# Patient Record
Sex: Female | Born: 1955 | Race: White | Hispanic: No | Marital: Married | Smoking: Former smoker
Health system: Southern US, Community
[De-identification: ages and names within clinical notes are randomized; demographics above are authoritative.]

## PROBLEM LIST (undated history)

## (undated) DIAGNOSIS — I10 Essential (primary) hypertension: Secondary | ICD-10-CM

## (undated) DIAGNOSIS — A6000 Herpesviral infection of urogenital system, unspecified: Secondary | ICD-10-CM

## (undated) DIAGNOSIS — E039 Hypothyroidism, unspecified: Secondary | ICD-10-CM

## (undated) DIAGNOSIS — G4730 Sleep apnea, unspecified: Secondary | ICD-10-CM

## (undated) DIAGNOSIS — M797 Fibromyalgia: Secondary | ICD-10-CM

## (undated) HISTORY — DX: Herpesviral infection of urogenital system, unspecified: A60.00

## (undated) HISTORY — DX: Fibromyalgia: M79.7

## (undated) HISTORY — DX: Essential (primary) hypertension: I10

## (undated) HISTORY — DX: Hypothyroidism, unspecified: E03.9

## (undated) HISTORY — PX: TEMPOROMANDIBULAR JOINT SURGERY: SHX35

## (undated) HISTORY — DX: Sleep apnea, unspecified: G47.30

---

## 1998-04-20 ENCOUNTER — Other Ambulatory Visit: Admission: RE | Admit: 1998-04-20 | Discharge: 1998-04-20 | Payer: Self-pay | Admitting: *Deleted

## 1999-06-25 ENCOUNTER — Other Ambulatory Visit: Admission: RE | Admit: 1999-06-25 | Discharge: 1999-06-25 | Payer: Self-pay | Admitting: Obstetrics & Gynecology

## 2000-01-25 ENCOUNTER — Encounter: Admission: RE | Admit: 2000-01-25 | Discharge: 2000-01-25 | Payer: Self-pay | Admitting: Otolaryngology

## 2000-01-25 ENCOUNTER — Encounter: Payer: Self-pay | Admitting: Otolaryngology

## 2000-02-25 ENCOUNTER — Encounter: Admission: RE | Admit: 2000-02-25 | Discharge: 2000-02-25 | Payer: Self-pay | Admitting: Oral Surgery

## 2000-02-25 ENCOUNTER — Encounter: Payer: Self-pay | Admitting: Oral Surgery

## 2000-02-28 ENCOUNTER — Ambulatory Visit (HOSPITAL_BASED_OUTPATIENT_CLINIC_OR_DEPARTMENT_OTHER): Admission: RE | Admit: 2000-02-28 | Discharge: 2000-02-29 | Payer: Self-pay | Admitting: Oral Surgery

## 2000-07-17 ENCOUNTER — Other Ambulatory Visit: Admission: RE | Admit: 2000-07-17 | Discharge: 2000-07-17 | Payer: Self-pay | Admitting: Obstetrics and Gynecology

## 2000-09-08 ENCOUNTER — Ambulatory Visit (HOSPITAL_COMMUNITY): Admission: RE | Admit: 2000-09-08 | Discharge: 2000-09-08 | Payer: Self-pay | Admitting: *Deleted

## 2000-09-08 ENCOUNTER — Encounter (INDEPENDENT_AMBULATORY_CARE_PROVIDER_SITE_OTHER): Payer: Self-pay | Admitting: Specialist

## 2001-07-17 ENCOUNTER — Other Ambulatory Visit: Admission: RE | Admit: 2001-07-17 | Discharge: 2001-07-17 | Payer: Self-pay | Admitting: Obstetrics and Gynecology

## 2002-03-13 ENCOUNTER — Ambulatory Visit (HOSPITAL_BASED_OUTPATIENT_CLINIC_OR_DEPARTMENT_OTHER): Admission: RE | Admit: 2002-03-13 | Discharge: 2002-03-13 | Payer: Self-pay | Admitting: *Deleted

## 2002-03-14 ENCOUNTER — Encounter (INDEPENDENT_AMBULATORY_CARE_PROVIDER_SITE_OTHER): Payer: Self-pay | Admitting: Specialist

## 2002-09-18 ENCOUNTER — Other Ambulatory Visit: Admission: RE | Admit: 2002-09-18 | Discharge: 2002-09-18 | Payer: Self-pay | Admitting: Obstetrics and Gynecology

## 2003-04-15 ENCOUNTER — Encounter: Admission: RE | Admit: 2003-04-15 | Discharge: 2003-04-15 | Payer: Self-pay

## 2004-01-06 ENCOUNTER — Other Ambulatory Visit: Admission: RE | Admit: 2004-01-06 | Discharge: 2004-01-06 | Payer: Self-pay | Admitting: Obstetrics and Gynecology

## 2004-10-07 ENCOUNTER — Emergency Department (HOSPITAL_COMMUNITY): Admission: EM | Admit: 2004-10-07 | Discharge: 2004-10-07 | Payer: Self-pay | Admitting: Family Medicine

## 2004-10-10 ENCOUNTER — Emergency Department (HOSPITAL_COMMUNITY): Admission: EM | Admit: 2004-10-10 | Discharge: 2004-10-10 | Payer: Self-pay | Admitting: Family Medicine

## 2005-08-05 ENCOUNTER — Other Ambulatory Visit: Admission: RE | Admit: 2005-08-05 | Discharge: 2005-08-05 | Payer: Self-pay | Admitting: Obstetrics and Gynecology

## 2006-07-25 ENCOUNTER — Emergency Department (HOSPITAL_COMMUNITY): Admission: EM | Admit: 2006-07-25 | Discharge: 2006-07-25 | Payer: Self-pay | Admitting: Emergency Medicine

## 2006-08-22 ENCOUNTER — Ambulatory Visit: Payer: Self-pay | Admitting: Family Medicine

## 2006-08-23 ENCOUNTER — Ambulatory Visit: Payer: Self-pay | Admitting: *Deleted

## 2006-09-25 ENCOUNTER — Ambulatory Visit: Payer: Self-pay | Admitting: Family Medicine

## 2006-10-31 ENCOUNTER — Ambulatory Visit: Payer: Self-pay | Admitting: Family Medicine

## 2006-12-01 ENCOUNTER — Ambulatory Visit: Payer: Self-pay | Admitting: Family Medicine

## 2006-12-06 ENCOUNTER — Encounter: Admission: RE | Admit: 2006-12-06 | Discharge: 2006-12-06 | Payer: Self-pay | Admitting: Family Medicine

## 2006-12-15 ENCOUNTER — Ambulatory Visit: Payer: Self-pay | Admitting: Family Medicine

## 2006-12-22 ENCOUNTER — Ambulatory Visit: Payer: Self-pay | Admitting: Family Medicine

## 2006-12-22 LAB — CONVERTED CEMR LAB
BUN: 19 mg/dL (ref 6–23)
Creatinine, Ser: 0.75 mg/dL (ref 0.40–1.20)
Glucose, Bld: 91 mg/dL (ref 70–99)
Sodium: 142 meq/L (ref 135–145)

## 2007-01-02 ENCOUNTER — Ambulatory Visit (HOSPITAL_COMMUNITY): Admission: RE | Admit: 2007-01-02 | Discharge: 2007-01-02 | Payer: Self-pay | Admitting: Family Medicine

## 2007-01-09 ENCOUNTER — Emergency Department (HOSPITAL_COMMUNITY): Admission: EM | Admit: 2007-01-09 | Discharge: 2007-01-09 | Payer: Self-pay | Admitting: Emergency Medicine

## 2007-01-23 ENCOUNTER — Ambulatory Visit: Payer: Self-pay | Admitting: Family Medicine

## 2007-01-23 LAB — CONVERTED CEMR LAB
TSH: 4.627 microintl units/mL (ref 0.350–5.50)
Total CHOL/HDL Ratio: 5
VLDL: 25 mg/dL (ref 0–40)

## 2007-05-15 ENCOUNTER — Ambulatory Visit: Payer: Self-pay | Admitting: Family Medicine

## 2007-05-15 LAB — CONVERTED CEMR LAB
Chloride: 98 meq/L (ref 96–112)
Potassium: 3.4 meq/L — ABNORMAL LOW (ref 3.5–5.3)
Sodium: 139 meq/L (ref 135–145)

## 2007-05-18 ENCOUNTER — Ambulatory Visit (HOSPITAL_COMMUNITY): Admission: RE | Admit: 2007-05-18 | Discharge: 2007-05-18 | Payer: Self-pay | Admitting: Family Medicine

## 2007-06-18 ENCOUNTER — Encounter: Admission: RE | Admit: 2007-06-18 | Discharge: 2007-06-18 | Payer: Self-pay | Admitting: Family Medicine

## 2007-06-21 ENCOUNTER — Ambulatory Visit: Payer: Self-pay | Admitting: Family Medicine

## 2007-06-21 LAB — CONVERTED CEMR LAB
CO2: 23 meq/L (ref 19–32)
Free T4: 1.09 ng/dL (ref 0.89–1.80)
Potassium: 3.6 meq/L (ref 3.5–5.3)
Sodium: 144 meq/L (ref 135–145)
T3, Total: 147.2 ng/dL (ref 80.0–204.0)
T4, Total: 8.3 ug/dL (ref 5.0–12.5)
TSH: 5.314 microintl units/mL (ref 0.350–5.50)

## 2007-07-19 ENCOUNTER — Ambulatory Visit: Payer: Self-pay | Admitting: Family Medicine

## 2007-08-24 ENCOUNTER — Ambulatory Visit: Payer: Self-pay | Admitting: Family Medicine

## 2007-12-06 ENCOUNTER — Ambulatory Visit (HOSPITAL_BASED_OUTPATIENT_CLINIC_OR_DEPARTMENT_OTHER): Admission: RE | Admit: 2007-12-06 | Discharge: 2007-12-06 | Payer: Self-pay | Admitting: Family Medicine

## 2007-12-10 ENCOUNTER — Ambulatory Visit: Payer: Self-pay | Admitting: Internal Medicine

## 2007-12-14 ENCOUNTER — Emergency Department (HOSPITAL_COMMUNITY): Admission: EM | Admit: 2007-12-14 | Discharge: 2007-12-14 | Payer: Self-pay | Admitting: Family Medicine

## 2008-01-14 ENCOUNTER — Ambulatory Visit: Payer: Self-pay | Admitting: Internal Medicine

## 2008-03-03 ENCOUNTER — Encounter: Admission: RE | Admit: 2008-03-03 | Discharge: 2008-03-03 | Payer: Self-pay | Admitting: Family Medicine

## 2008-04-01 ENCOUNTER — Ambulatory Visit: Payer: Self-pay | Admitting: Family Medicine

## 2008-04-01 LAB — CONVERTED CEMR LAB
ALT: 12 units/L (ref 0–35)
Albumin: 4.3 g/dL (ref 3.5–5.2)
Calcium: 9.8 mg/dL (ref 8.4–10.5)
Potassium: 3.2 meq/L — ABNORMAL LOW (ref 3.5–5.3)
Sodium: 141 meq/L (ref 135–145)
Total Bilirubin: 0.5 mg/dL (ref 0.3–1.2)
Total Protein: 7.5 g/dL (ref 6.0–8.3)
Vit D, 1,25-Dihydroxy: 21 — ABNORMAL LOW (ref 30–89)

## 2008-08-21 ENCOUNTER — Ambulatory Visit: Payer: Self-pay | Admitting: Internal Medicine

## 2008-08-21 ENCOUNTER — Encounter (INDEPENDENT_AMBULATORY_CARE_PROVIDER_SITE_OTHER): Payer: Self-pay | Admitting: Family Medicine

## 2008-08-21 LAB — CONVERTED CEMR LAB
HCV Ab: NEGATIVE
Hep B E Ab: NEGATIVE
Hepatitis B Surface Ag: NEGATIVE

## 2009-09-07 ENCOUNTER — Emergency Department (HOSPITAL_COMMUNITY): Admission: EM | Admit: 2009-09-07 | Discharge: 2009-09-07 | Payer: Self-pay | Admitting: Family Medicine

## 2009-11-02 ENCOUNTER — Emergency Department (HOSPITAL_COMMUNITY): Admission: EM | Admit: 2009-11-02 | Discharge: 2009-11-02 | Payer: Self-pay | Admitting: Emergency Medicine

## 2009-11-02 ENCOUNTER — Ambulatory Visit: Payer: Self-pay | Admitting: Vascular Surgery

## 2009-11-02 ENCOUNTER — Encounter (INDEPENDENT_AMBULATORY_CARE_PROVIDER_SITE_OTHER): Payer: Self-pay | Admitting: Emergency Medicine

## 2009-11-03 ENCOUNTER — Observation Stay (HOSPITAL_COMMUNITY): Admission: EM | Admit: 2009-11-03 | Discharge: 2009-11-04 | Payer: Self-pay | Admitting: Emergency Medicine

## 2009-11-24 ENCOUNTER — Ambulatory Visit: Payer: Self-pay | Admitting: Family Medicine

## 2009-11-26 ENCOUNTER — Encounter (HOSPITAL_COMMUNITY): Admission: RE | Admit: 2009-11-26 | Discharge: 2010-01-27 | Payer: Self-pay | Admitting: Interventional Cardiology

## 2010-06-13 ENCOUNTER — Encounter: Payer: Self-pay | Admitting: Occupational Therapy

## 2010-06-13 ENCOUNTER — Encounter: Payer: Self-pay | Admitting: Family Medicine

## 2010-06-29 ENCOUNTER — Encounter (INDEPENDENT_AMBULATORY_CARE_PROVIDER_SITE_OTHER): Payer: Self-pay | Admitting: Family Medicine

## 2010-06-29 LAB — CONVERTED CEMR LAB
BUN: 16 mg/dL (ref 6–23)
CO2: 29 meq/L (ref 19–32)
Calcium: 9.8 mg/dL (ref 8.4–10.5)
Creatinine, Ser: 0.77 mg/dL (ref 0.40–1.20)
Potassium: 3.8 meq/L (ref 3.5–5.3)
TSH: 3.921 microintl units/mL (ref 0.350–4.500)
Vit D, 25-Hydroxy: 20 ng/mL — ABNORMAL LOW (ref 30–89)

## 2010-08-09 LAB — LIPID PANEL
HDL: 34 mg/dL — ABNORMAL LOW (ref >39)
LDL Cholesterol: 98 mg/dL (ref 0–99)
Total CHOL/HDL Ratio: 4.5 RATIO
Triglycerides: 106 mg/dL (ref <150)
VLDL: 21 mg/dL (ref 0–40)

## 2010-08-09 LAB — CK TOTAL AND CKMB (NOT AT ARMC)
Relative Index: INVALID (ref 0.0–2.5)
Total CK: 92 U/L (ref 7–177)

## 2010-08-09 LAB — CARDIAC PANEL(CRET KIN+CKTOT+MB+TROPI)
CK, MB: 1.9 ng/mL (ref 0.3–4.0)
CK, MB: 2 ng/mL (ref 0.3–4.0)
Relative Index: INVALID (ref 0.0–2.5)
Troponin I: 0.01 ng/mL (ref 0.00–0.06)

## 2010-08-09 LAB — POCT CARDIAC MARKERS: Myoglobin, poc: 109 ng/mL (ref 12–200)

## 2010-08-09 LAB — BASIC METABOLIC PANEL
CO2: 33 mEq/L — ABNORMAL HIGH (ref 19–32)
Calcium: 9.4 mg/dL (ref 8.4–10.5)
Creatinine, Ser: 0.74 mg/dL (ref 0.4–1.2)
GFR calc Af Amer: >60 mL/min (ref >60)
GFR calc non Af Amer: >60 mL/min (ref >60)
Potassium: 3.1 mEq/L — ABNORMAL LOW (ref 3.5–5.1)

## 2010-08-09 LAB — DIFFERENTIAL
Basophils Relative: 0 % (ref 0–1)
Lymphocytes Relative: 23 % (ref 12–46)
Neutro Abs: 5 10*3/uL (ref 1.7–7.7)

## 2010-08-09 LAB — CBC
HCT: 40.4 % (ref 36.0–46.0)
Hemoglobin: 14 g/dL (ref 12.0–15.0)
WBC: 7.1 10*3/uL (ref 4.0–10.5)

## 2010-08-09 LAB — TROPONIN I: Troponin I: 0.01 ng/mL (ref 0.00–0.06)

## 2010-08-09 LAB — URINALYSIS, ROUTINE W REFLEX MICROSCOPIC
Bilirubin Urine: NEGATIVE
Glucose, UA: NEGATIVE mg/dL
Hgb urine dipstick: NEGATIVE
Ketones, ur: NEGATIVE mg/dL
Nitrite: NEGATIVE
Specific Gravity, Urine: 1.012 (ref 1.005–1.030)
pH: 7 (ref 5.0–8.0)

## 2010-09-13 ENCOUNTER — Other Ambulatory Visit (HOSPITAL_COMMUNITY): Payer: Self-pay | Admitting: Family Medicine

## 2010-09-13 DIAGNOSIS — Z1231 Encounter for screening mammogram for malignant neoplasm of breast: Secondary | ICD-10-CM

## 2010-09-20 ENCOUNTER — Ambulatory Visit (HOSPITAL_COMMUNITY): Payer: Self-pay

## 2010-09-28 ENCOUNTER — Ambulatory Visit (HOSPITAL_COMMUNITY)
Admission: RE | Admit: 2010-09-28 | Discharge: 2010-09-28 | Disposition: A | Discharge status: Home / Self Care | Payer: Self-pay | Source: Ambulatory Visit | Attending: Family Medicine | Admitting: Family Medicine

## 2010-09-28 DIAGNOSIS — Z1231 Encounter for screening mammogram for malignant neoplasm of breast: Secondary | ICD-10-CM | POA: Insufficient documentation

## 2010-10-05 NOTE — Procedures (Signed)
NAMEELYANA, GRABSKI                  ACCOUNT NO.:  [id number]   MEDICAL RECORD NO.:  [id number]          PATIENT TYPE:  OUT   LOCATION:  SLEEP CENTER                 FACILITY:  [facility]   PHYSICIAN:  Clinton D. Maple Hudson, MD, FCCP, FACPDATE OF BIRTH:  March 28, 1956   DATE OF STUDY:  12/06/2007                            NOCTURNAL POLYSOMNOGRAM   REFERRING PHYSICIAN:  Maurice March, M.D.   INDICATION FOR STUDY:  Insomnia with sleep apnea.   EPWORTH SLEEPINESS SCORE:  7/24, BMI 44, weight 227 pounds, height 60.5  inches, neck 15 inches.   MEDICATIONS:  Home medications charted and reviewed.   SLEEP ARCHITECTURE:  Split study protocol.  During the diagnostic phase,  total sleep time 121.5 minutes with sleep efficiency 76.2%.  Stage I was  3.7%, stage II 82.7%, stage III absent, REM 13.6% of total sleep time.  Sleep latency 32 minutes, REM latency 52 minutes, awake after sleep  onset 6 minutes, arousal index 37.5.  No bedtime medication taken.   RESPIRATORY DATA:  Split study protocol.  Apnea-hypopnea index (AHI)  29.1 per hour.  Fifty-nine events were scored, all hypopneas recorded  while sleeping non-supine.  REM AHI 69.1.  CPAP was titrated to 12 CWP,  AHI 0 per hour.  She chose a small Snapp mask with heated humidifier.   OXYGEN DATA:  Moderate snoring before CPAP with oxygen desaturation to a  nadir of 81%.  After CPAP control mean oxygen saturation held 92.7% on  room air.   CARDIAC DATA:  Normal sinus rhythm with occasional PVC.   MOVEMENT-PARASOMNIA:  No significant movement disturbance.  Bathroom x1.   IMPRESSIONS-RECOMMENDATIONS:  1. Moderate obstructive sleep apnea/hypopnea syndrome, apnea-hypopnea      index 29.1 per hour.  All events were hypopneas recorded while      sleeping off of her back.  Moderate snoring with oxygen      desaturation to a nadir of 81%.  2. Successful continuous positive airway titration to 12 centimeters      of water pressure, apnea-hypopnea  index 0 per hour.  She chose a      small Snapp mask with heated humidifier.      Clinton D. Maple Hudson, MD, [facility], FACP  Diplomate, Biomedical engineer of Sleep Medicine  Electronically Signed     CDY/MEDQ  D:  12/08/2007 13:49:40  T:  12/08/2007 14:35:22  Job:  [id number]

## 2010-10-08 NOTE — Op Note (Signed)
[facility]  Patient:    Angelica Hess, Angelica Hess                         MRN: [id number] Proc. Date: 02/28/00 Adm. 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  OPERATION:  Bilateral sagittal split ramus osteotomies with mandibular advancement of approximately 10 mm, insertion of custom acrylic malocclusial appliance, rigid internal fixation utilizing the Ryder System 2.0 mm titanium screw system.  SURGEON:  Vania Rea. Warren Danes, D.D.S.  ANESTHESIA:  General via nasal endotracheal intubation.  ESTIMATED BLOOD LOSS:  Less than 250 cc  FLUID REPLACEMENT:  Approximately 1100 cc of crystalloid solution.  COMPLICATIONS:  None apparent.  INDICATION FOR PROCEDURE:  Mrs. 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  The patient presented with chronic complaints of inability to bite and incise foods in a normal type fashion and chronic subluxation of the temporary mandibular joints as a result of the severe mandibular retrognathia.  All conservative measures had been used to correct this conservatively including splint therapy, orthodontics, and the use of nonsteroidal anti-inflammatory medications but the mandibular retrognathia is too severe for any other means of therapy likely to correct this other than surgical intervention.  DESCRIPTION OF PROCEDURE:  On February 28, 2000, Mrs. Marianna Payment was taken to [facility] where she was placed on the operating room table in a supine position.  Following successful nasal endotracheal intubation and general anesthesia, the patients face, neck and oral cavity were prepped and draped in the usual sterile operating room fashion.  The hypopharynx was suctioned free of fluid and secretions, and a moistened 2 inch vaginal pack was placed as a direct pack.  Attention was then directed intraorally, where approximately 10 cc of 1/2% xylocaine containing 1:200,000 epinephrine were infiltrated in the right inferior alveolar neurovascular region and the soft tissues over the ramus and right posterior mandibular buccal vestibule.  A #15 Bard Parker blade was then used to create a 2.5 curvilinear incision beginning in the soft tissues of the ramus and brought through the mucoperiosteal tissues of the right lateral oblique ridge to a distance of approximately 1.5 cm lateral to the second molar tooth. A #9 Molt periosteal elevator was used to reflect a full thickness mucoperiosteal flap laterally and inferiorly to the inferior border of the mandible which was identified using a curved Therapist, nutritional.  The mucosal periosteal tissues were then reflected off of the oblique ridge up the ascending ramus to a distance of approximately 1.5 cm from the tip of the coronoid process by a Dingman bone clamp was placed to retract the tissue superiorly.  The coronoid notch was then identified via a medial approach using a double ended Molt curet and from this level inferiorly the periosteum was reflected to the lingula which was identified and a Selden retractor placed to protect the neurovascular bundle.  A Stryker rotatory osteotome with a 107 Fisher bur followed a Lindeman bur was then used to create a medial, cortical,  horizontal osteotomy from just above the lingula to the anterior midline of the ramus.  A channel retractor was then placed to protect the lateral soft tissues and then the [facility] bur was used to create a lateral, vertical, cortical  osteotomy from the inferior border of the mandible to a distance of approximately 1 cm lateral to the second molar tooth.  The osteotomies were then joined across the superior oblique ridge portion of the mandible to superior cortical bone using 107 Fisher bur.  In a similar fashion, the left mandible was cut in preparation for a sagittal split ramus osteotomy.  The left mandible was then split in a sagittal fashion using the mandibular osteotomes with gentle tapping and twisting pressures from the fiberglass tip mallet.  The mandible split appropriately and the neurovascular bundle remained intact.  The pterygomasseteric apparatus was then detached from the distal portion of the mandible using a pterygomasseteric sling stripper.  In a similar fashion, the right mandible was split in a sagittal fashion, the neurovascular bundle remaining intact and the pterygomasseteric apparatus detached from the inferior border of the distal portion of the mandible.  A custom acrylic interocclusal appliance was then affixed to the maxillary dentition and secured by passing four 28 gauge stainless wire ligature loops in a circum orthodontic fashion where the ends of the wires were cut, twisted and rosetted atraumatically against the buccal surface of the gingival soft tissues.  The oral cavity and surgical sites were then thoroughly irrigated with sterile saline irrigating solution and suctioned.  The throat pack was removed and the hypopharynx suctioned free of fluids and secretions.  The mandible was now advanced approximately 10 mm and the mandibular dentition fit appropriately into the undersurface of the appliance.  The teeth were then secured in this fashion in an anatomic manner into the splint using 26 gauge stainless steel wire ligature loops with the ends of the wires cut, twisted and rosetted atraumatically against the buccal surface of the dentition.  The columellar segments were then  posteriorly and superiorly into the glenoid fossa with digital pressure being used to ascertain sitting of the condyles bilaterally.  The condylar segments were then secured by passing bilateral 24  gauge stainless steel wire ligature loops in a circum mandibular fashion. Rigid internal fixation was then applied in the following manner:  The 56M minidriver with 1.5 mm drill was used to create three bicortical holes across the superior oblique ridge portion of the mandible bilaterally.  The holes were then tapped using the Zachery Dauer 2.0 mm tap and appropriate screws were placed with three being closed on either side of the mandible.  The aforementioned 24 gauge circum mandibular stainless steel wires were then cut and removed from the surgical site.  The surgical sites were then copiously irrigated with sterile saline irrigating solution and suctioned.  The mucoperiosteal margins were then approximated in a water tight fashion using 4-0 Vicryl material on an RB1 needle.  The intermaxillary fixation loops were then cut and removed from the oral cavity and the mandible was manipulated and found to fit appropriately without hindrance or deviation.  Class I elastics were then placed bilaterally and the patient was allowed to awaken from the anesthesia and taken to the recovery room where she tolerated the procedure well and without apparent complication. DD:  02/28/00 TD:  02/29/00 Job: [id number]

## 2010-10-08 NOTE — Op Note (Signed)
   NAMEMarianna Hess, Angelica Hess                            ACCOUNT NO.:  [id number]   MEDICAL RECORD NO.:  [id number]                   PATIENT TYPE:  AMB   LOCATION:  DSC                                  FACILITY:  [facility]   PHYSICIAN:  Vikki Ports, M.D.         DATE OF BIRTH:  01/01/1956   DATE OF PROCEDURE:  03/13/2002  DATE OF DISCHARGE:  03/13/2002                                 OPERATIVE REPORT   PREOPERATIVE DIAGNOSES:  Rule out polymyositis.   POSTOPERATIVE DIAGNOSES:  Rule out polymyositis.   OPERATION PERFORMED:  Left quadriceps muscle biopsy.   SURGEON:  Vikki Ports, M.D.   ANESTHESIA:  General.   DESCRIPTION OF PROCEDURE:  The patient was taken to the operating room and  placed in supine position.  The medial portion of the left thigh was prepped  and draped in the normal sterile fashion.  Using a vertical incision, I  dissected down through the subcutaneous fat down onto the fascia of the  quadriceps muscle.  This was incised.  About a 4 to 5 mm diameter segment of  muscle was mobilized using a hemostat.  Each end was clamped.  A Q-Tip was  placed over the specimen and each end was ligated using 2-0 silk ligature.  It was divided and sent to pathology for evaluation.  The edges of clamped  hemostats were ligated using 2-0 silk ligatures.  Adequate hemostasis was  ensured.  The fascia was closed with a 2-0 Vicryl suture.  The skin was  closed with a subcuticular 4-0 Monocryl.  Steri-Strips were applied.  The  patient tolerated the procedure well and went to PACU in good condition.                                                Vikki Ports, M.D.    KRH/MEDQ  D:  03/16/2002  T:  03/18/2002  Job:  [id number]   cc:   Areatha Keas, M.D.  [address]  [address]  [place]  [place] [address]  Fax: [phone]

## 2010-10-08 NOTE — Op Note (Signed)
[facility]  Patient:    Angelica Hess, Angelica Hess                         MRN: [id number] Proc. Date: 09/08/00 Adm. Date:  [id number] Attending:  Vikki Ports.                           Operative Report  PREOPERATIVE DIAGNOSIS:  Multiple subcutaneous nodules, lipomas.  POSTOPERATIVE DIAGNOSIS:  Multiple subcutaneous nodules, lipomas.  PROCEDURE:  Excision of multiple subcutaneous lipomas and placements are  1. Bilateral lower back, each 5 cm, one left upper arm, 3 cm. 2. Left thigh, 5 cm and 3 cm, one right lower abdominal wall, 2 cm three    right lower arm, 1 cm each and two right upper arms, 2 cm and 1 cm.  ANESTHESIA:  General.  SURGEON:  Vikki Ports, M.D.  DESCRIPTION OF PROCEDURE:  The patient was taken to the operating room and placed in the supine position.  After adequate general anesthesia was induced, the patient was placed in the left lateral decubitus position.  The lower back was prepped and draped in a normal sterile fashion.  Transverse incisions were made over the two largest palpable nodules in the lower back.  Of note, the patient had multiple, in the 100s, subcutaneous nodules.  Incisions were made over the largest two, dissected down and delivered, each 4-5 cm lipoma from each wound.  The skin was closed with 3-0 nylon suture.  The patient was then placed in the prone position.  In the above position, incisions were made over all the underlying nodules, dissecting down and delivering what appeared to be benign fatty lobules through the wounds.  They were all closed with 3-0 nylon suture.  Sterile dressings were applied to all of them.  The patient was taken to PACU in good condition. DD:  09/08/00 TD:  09/09/00 Job: [id number]

## 2010-11-16 ENCOUNTER — Other Ambulatory Visit: Payer: Self-pay | Admitting: Family Medicine

## 2013-01-04 ENCOUNTER — Ambulatory Visit: Payer: Self-pay | Admitting: Family Medicine

## 2013-01-07 ENCOUNTER — Telehealth: Payer: Self-pay | Admitting: Family Medicine

## 2013-01-07 NOTE — Telephone Encounter (Signed)
apptmt scheduled for 01/14/2013 @ 11:45 a.m.

## 2013-01-07 NOTE — Telephone Encounter (Signed)
Pt thought her new pt appmt w/you was this coming Friday 01/11/2013, however, it was actually last Friday, 01/04/2013.  She is very apologetic and embarrassed.  Your next new pat apptmt is not until October, 2014, but she is on b/p and thyroid meds which will run out by the end of August.  Can you accommodate her a new patient apptmt prior to then end of this month? She is off on Aug. 22nd and 25th.  Thank you.

## 2013-01-07 NOTE — Telephone Encounter (Signed)
Ok to use 11:45 and 12:00 slots on 8/25

## 2013-01-14 ENCOUNTER — Other Ambulatory Visit (HOSPITAL_COMMUNITY)
Admission: RE | Admit: 2013-01-14 | Discharge: 2013-01-14 | Disposition: A | Discharge status: Home / Self Care | Payer: BC Managed Care – PPO | Source: Ambulatory Visit | Attending: Family Medicine | Admitting: Family Medicine

## 2013-01-14 ENCOUNTER — Other Ambulatory Visit: Payer: Self-pay

## 2013-01-14 ENCOUNTER — Encounter: Payer: Self-pay | Admitting: Family Medicine

## 2013-01-14 ENCOUNTER — Ambulatory Visit (INDEPENDENT_AMBULATORY_CARE_PROVIDER_SITE_OTHER): Payer: BC Managed Care – PPO | Admitting: Family Medicine

## 2013-01-14 VITALS — BP 140/92 | HR 82 | Temp 98.0°F | Ht 61.0 in | Wt 256.0 lb

## 2013-01-14 DIAGNOSIS — Z1151 Encounter for screening for human papillomavirus (HPV): Secondary | ICD-10-CM | POA: Insufficient documentation

## 2013-01-14 DIAGNOSIS — Z1231 Encounter for screening mammogram for malignant neoplasm of breast: Secondary | ICD-10-CM

## 2013-01-14 DIAGNOSIS — IMO0001 Reserved for inherently not codable concepts without codable children: Secondary | ICD-10-CM

## 2013-01-14 DIAGNOSIS — Z01419 Encounter for gynecological examination (general) (routine) without abnormal findings: Secondary | ICD-10-CM | POA: Insufficient documentation

## 2013-01-14 DIAGNOSIS — E039 Hypothyroidism, unspecified: Secondary | ICD-10-CM

## 2013-01-14 DIAGNOSIS — R413 Other amnesia: Secondary | ICD-10-CM | POA: Insufficient documentation

## 2013-01-14 DIAGNOSIS — M797 Fibromyalgia: Secondary | ICD-10-CM | POA: Insufficient documentation

## 2013-01-14 DIAGNOSIS — I10 Essential (primary) hypertension: Secondary | ICD-10-CM

## 2013-01-14 DIAGNOSIS — B9689 Other specified bacterial agents as the cause of diseases classified elsewhere: Secondary | ICD-10-CM | POA: Insufficient documentation

## 2013-01-14 DIAGNOSIS — Z0000 Encounter for general adult medical examination without abnormal findings: Secondary | ICD-10-CM | POA: Insufficient documentation

## 2013-01-14 DIAGNOSIS — A6000 Herpesviral infection of urogenital system, unspecified: Secondary | ICD-10-CM | POA: Insufficient documentation

## 2013-01-14 DIAGNOSIS — R350 Frequency of micturition: Secondary | ICD-10-CM | POA: Insufficient documentation

## 2013-01-14 DIAGNOSIS — Z136 Encounter for screening for cardiovascular disorders: Secondary | ICD-10-CM

## 2013-01-14 LAB — COMPREHENSIVE METABOLIC PANEL
ALT: 15 U/L (ref 0–35)
AST: 14 U/L (ref 0–37)
Albumin: 4.1 g/dL (ref 3.5–5.2)
Alkaline Phosphatase: 102 U/L (ref 39–117)
Calcium: 9.3 mg/dL (ref 8.4–10.5)
Chloride: 104 mEq/L (ref 96–112)
Potassium: 3.7 mEq/L (ref 3.5–5.1)

## 2013-01-14 LAB — POCT URINALYSIS DIPSTICK
Bilirubin, UA: NEGATIVE
Glucose, UA: NEGATIVE
Nitrite, UA: NEGATIVE
Urobilinogen, UA: NEGATIVE
pH, UA: 5.5

## 2013-01-14 LAB — LIPID PANEL
HDL: 41.2 mg/dL (ref >39.00)
LDL Cholesterol: 134 mg/dL — ABNORMAL HIGH (ref 0–99)
Total CHOL/HDL Ratio: 5
Triglycerides: 126 mg/dL (ref 0.0–149.0)

## 2013-01-14 LAB — T4, FREE: Free T4: 0.71 ng/dL (ref 0.60–1.60)

## 2013-01-14 LAB — TSH: TSH: 6.84 u[IU]/mL — ABNORMAL HIGH (ref 0.35–5.50)

## 2013-01-14 MED ORDER — DILTIAZEM HCL 120 MG PO TABS: 120.0000 mg | ORAL_TABLET | Freq: Two times a day (BID) | ORAL | Status: DC

## 2013-01-14 MED ORDER — LEVOTHYROXINE SODIUM 100 MCG PO TABS: 100.0000 ug | ORAL_TABLET | Freq: Every day | ORAL | Status: DC

## 2013-01-14 NOTE — Progress Notes (Signed)
Subjective:    Patient ID: Angelica Hess, female    DOB: 02/09/1956, 57 y.o.   MRN: [id number]  HPI  57 yo G4P1 here to establish care and for GYN exam.  Last period 1 year ago.  No post menopausal bleeding. Does have h/o recurrent BV and genital herpes.  Has appt for mammogram on 02/04/2013.  HTN- has been well controlled on HCTZ 25 mg daily and Diltiazem 12 mg twice daily.  Denies any HA, blurred vision, CP or SOB.  Hypothyroidism-has been on current dose of synthroid for years.  Has noticed some changes in her hair, otherwise denies any symptoms of hypo or hyperthyroidism.  Fibromyalgia- symptoms improved with water aerobics and vit D supplements.  Was previously on Cymbalta.  Urinary frequency- past week increased frequency.  No dysuria.  Patient Active Problem List   Diagnosis Date Noted  . Encounter for routine gynecological examination 01/14/2013  . Routine general medical examination at a health care facility 01/14/2013  . Memory loss 01/14/2013  . Bacterial vaginosis 01/14/2013  . Urinary frequency 01/14/2013  . Hypertension   . Hypothyroidism   . Genital herpes   . Fibromyalgia    Past Medical History  Diagnosis Date  . Hypertension   . Hypothyroidism   . Genital herpes   . Fibromyalgia   . Sleep apnea    Past Surgical History  Procedure Laterality Date  . Cesarean section    . Temporomandibular joint surgery     History  Substance Use Topics  . Smoking status: Former Games developer  . Smokeless tobacco: Not on file  . Alcohol Use: Not on file   Family History  Problem Relation Age of Onset  . Alzheimer's disease Mother   . Cancer Father     breast CA   Allergies  Allergen Reactions  . Penicillins Itching  . Sulfa Antibiotics     Allergy as a child   No current outpatient prescriptions on file prior to visit.   No current facility-administered medications on file prior to visit.   The PMH, PSH, Social History, Family History, Medications, and allergies  have been reviewed in [facility], and have been updated if relevant.   Review of Systems See HPI Patient reports no  vision/ hearing changes,anorexia, weight change, fever ,adenopathy, persistant / recurrent hoarseness, swallowing issues, chest pain, edema,persistant / recurrent cough, hemoptysis, dyspnea(rest, exertional, paroxysmal nocturnal), gastrointestinal  bleeding (melena, rectal bleeding), abdominal pain, excessive heart burn, focal weakness, severe memory loss, concerning skin lesions, depression, anxiety, abnormal bruising/bleeding, major joint swelling, breast masses or abnormal vaginal bleeding.       Objective:   Physical Exam BP 140/92  Pulse 82  Temp(Src) 98 F (36.7 C)  Ht 5\' 1"  (1.549 m)  Wt 256 lb (116.121 kg)  BMI 48.4 kg/m2  General:  Well-developed,well-nourished,in no acute distress; alert,appropriate and cooperative throughout examination Head:  normocephalic and atraumatic.   Lungs:  Normal respiratory effort, chest expands symmetrically. Lungs are clear to auscultation, no crackles or wheezes. Heart:  Normal rate and regular rhythm. S1 and S2 normal without gallop, murmur, click, rub or other extra sounds. Abdomen:  Bowel sounds positive,abdomen soft and non-tender without masses, organomegaly or hernias noted. Rectal:  no external abnormalities.   Genitalia:  Pelvic Exam:        External: normal female genitalia without lesions or masses        Vagina: normal without lesions or masses        Cervix: normal without lesions or masses  Adnexa: normal bimanual exam without masses or fullness        Uterus: normal by palpation        Pap smear: performed Msk:  No deformity or scoliosis noted of thoracic or lumbar spine.   Extremities:  No clubbing, cyanosis, edema, or deformity noted with normal full range of motion of all joints.   Neurologic:  alert & oriented X3 and gait normal.   Skin:  Intact without suspicious lesions or rashes Cervical Nodes:  No  lymphadenopathy noted Axillary Nodes:  No palpable lymphadenopathy Psych:  Cognition and judgment appear intact. Alert and cooperative with normal attention span and concentration. No apparent delusions, illusions, hallucinations      Assessment & Plan:  1. Hypertension Stable on current rx. Meds refilled. Follow up in 3 months. - Comprehensive metabolic panel  2. Hypothyroidism Recheck thyroid panel today. - TSH - T4, Free  3. Screening for ischemic heart disease  - Lipid panel  4. Encounter for routine gynecological examination Pap smear today.   5. Fibromyalgia  Symptoms improved with lifestyle improvement.  She is working on losing weight.  6. Urinary frequency

## 2013-01-14 NOTE — Patient Instructions (Addendum)
It was nice to meet you. We will call you with your lab and urine culture results.  You may also view them online.

## 2013-01-15 ENCOUNTER — Encounter: Payer: Self-pay | Admitting: Family Medicine

## 2013-01-16 ENCOUNTER — Encounter: Payer: Self-pay | Admitting: Family Medicine

## 2013-01-16 LAB — URINE CULTURE

## 2013-01-17 ENCOUNTER — Encounter: Payer: Self-pay | Admitting: *Deleted

## 2013-01-17 ENCOUNTER — Encounter: Payer: Self-pay | Admitting: Family Medicine

## 2013-01-17 ENCOUNTER — Other Ambulatory Visit: Payer: Self-pay | Admitting: Family Medicine

## 2013-01-17 MED ORDER — LEVOTHYROXINE SODIUM 112 MCG PO TABS: 112.0000 ug | ORAL_TABLET | Freq: Every day | ORAL | Status: DC

## 2013-01-22 ENCOUNTER — Other Ambulatory Visit: Payer: Self-pay | Admitting: *Deleted

## 2013-01-22 MED ORDER — SYNTHROID 112 MCG PO TABS: 112.0000 ug | ORAL_TABLET | Freq: Every day | ORAL | Status: DC

## 2013-01-24 ENCOUNTER — Ambulatory Visit: Payer: BC Managed Care – PPO | Admitting: Family Medicine

## 2013-02-04 ENCOUNTER — Ambulatory Visit
Admission: RE | Admit: 2013-02-04 | Discharge: 2013-02-04 | Disposition: A | Discharge status: Home / Self Care | Payer: No Typology Code available for payment source | Source: Ambulatory Visit

## 2013-02-04 DIAGNOSIS — Z1231 Encounter for screening mammogram for malignant neoplasm of breast: Secondary | ICD-10-CM

## 2013-03-28 ENCOUNTER — Other Ambulatory Visit: Payer: Self-pay

## 2013-10-31 ENCOUNTER — Telehealth: Payer: Self-pay

## 2013-10-31 NOTE — Telephone Encounter (Signed)
Pt left v/m; pts employer has requested pt to have repeat titer; pt employee files has last titer is 2004. Pt wants to know how often should she have titers and when titer is done what Hepatitis testing is done,( B and/or C)?pt request cb.

## 2013-10-31 NOTE — Telephone Encounter (Signed)
Lm on pts vm requesting a call back 

## 2013-10-31 NOTE — Telephone Encounter (Signed)
Yes ok to check titers.

## 2013-10-31 NOTE — Telephone Encounter (Signed)
Spoke to pt and advised per Dr Deborra Medina. Pt has upcoming appt 11/04/13 and states that she will have them completed at that time

## 2013-11-04 ENCOUNTER — Ambulatory Visit (INDEPENDENT_AMBULATORY_CARE_PROVIDER_SITE_OTHER): Payer: No Typology Code available for payment source | Admitting: Internal Medicine

## 2013-11-04 ENCOUNTER — Telehealth: Payer: Self-pay | Admitting: Family Medicine

## 2013-11-04 ENCOUNTER — Encounter: Payer: Self-pay | Admitting: Internal Medicine

## 2013-11-04 ENCOUNTER — Other Ambulatory Visit: Payer: Self-pay | Admitting: Internal Medicine

## 2013-11-04 VITALS — BP 128/84 | HR 74 | Temp 98.6°F | Wt 259.0 lb

## 2013-11-04 DIAGNOSIS — D1779 Benign lipomatous neoplasm of other sites: Secondary | ICD-10-CM

## 2013-11-04 DIAGNOSIS — I10 Essential (primary) hypertension: Secondary | ICD-10-CM

## 2013-11-04 DIAGNOSIS — D171 Benign lipomatous neoplasm of skin and subcutaneous tissue of trunk: Secondary | ICD-10-CM

## 2013-11-04 DIAGNOSIS — Z23 Encounter for immunization: Secondary | ICD-10-CM

## 2013-11-04 DIAGNOSIS — Z1159 Encounter for screening for other viral diseases: Secondary | ICD-10-CM

## 2013-11-04 DIAGNOSIS — E039 Hypothyroidism, unspecified: Secondary | ICD-10-CM

## 2013-11-04 DIAGNOSIS — A6000 Herpesviral infection of urogenital system, unspecified: Secondary | ICD-10-CM

## 2013-11-04 LAB — COMPREHENSIVE METABOLIC PANEL
ALBUMIN: 4.2 g/dL (ref 3.5–5.2)
ALT: 12 U/L (ref 0–35)
AST: 15 U/L (ref 0–37)
Alkaline Phosphatase: 104 U/L (ref 39–117)
BUN: 17 mg/dL (ref 6–23)
CALCIUM: 9.6 mg/dL (ref 8.4–10.5)
CHLORIDE: 101 meq/L (ref 96–112)
CO2: 31 mEq/L (ref 19–32)
Creatinine, Ser: 0.8 mg/dL (ref 0.4–1.2)
GFR: 73.9 mL/min (ref >60.00)
Glucose, Bld: 97 mg/dL (ref 70–99)
POTASSIUM: 3.8 meq/L (ref 3.5–5.1)
SODIUM: 140 meq/L (ref 135–145)
Total Bilirubin: 0.2 mg/dL (ref 0.2–1.2)
Total Protein: 7.2 g/dL (ref 6.0–8.3)

## 2013-11-04 LAB — CBC
HCT: 40.7 % (ref 36.0–46.0)
Hemoglobin: 13.5 g/dL (ref 12.0–15.0)
MCHC: 33.2 g/dL (ref 30.0–36.0)
MCV: 92.4 fl (ref 78.0–100.0)
Platelets: 265 10*3/uL (ref 150.0–400.0)
RBC: 4.4 Mil/uL (ref 3.87–5.11)
RDW: 13.6 % (ref 11.5–15.5)
WBC: 7.5 10*3/uL (ref 4.0–10.5)

## 2013-11-04 LAB — TSH: TSH: 3.44 u[IU]/mL (ref 0.35–4.50)

## 2013-11-04 MED ORDER — VALACYCLOVIR HCL 500 MG PO TABS: 500.0000 mg | ORAL_TABLET | Freq: Two times a day (BID) | ORAL | Status: DC

## 2013-11-04 MED ORDER — LEVOTHYROXINE SODIUM 112 MCG PO TABS: 112.0000 ug | ORAL_TABLET | Freq: Every day | ORAL | Status: DC

## 2013-11-04 MED ORDER — DILTIAZEM HCL 120 MG PO TABS: 120.0000 mg | ORAL_TABLET | Freq: Two times a day (BID) | ORAL | Status: DC

## 2013-11-04 MED ORDER — HYDROCHLOROTHIAZIDE 25 MG PO TABS: 25.0000 mg | ORAL_TABLET | Freq: Every day | ORAL | Status: DC

## 2013-11-04 NOTE — Assessment & Plan Note (Signed)
Encouraged her to work on diet and exercise 

## 2013-11-04 NOTE — Progress Notes (Signed)
Pre visit review using our clinic review tool, if applicable. No additional management support is needed unless otherwise documented below in the visit note. 

## 2013-11-04 NOTE — Addendum Note (Signed)
Addended by: Lurlean Nanny on: 11/04/2013 04:49 PM   Modules accepted: Orders

## 2013-11-04 NOTE — Telephone Encounter (Signed)
Relevant patient education assigned to patient using Emmi. ° °

## 2013-11-04 NOTE — Assessment & Plan Note (Signed)
Currently on Synthroid 112 mcg Will check TSH and free T4 today

## 2013-11-04 NOTE — Assessment & Plan Note (Signed)
Suppressed on Valtrex daily Medication refilled today

## 2013-11-04 NOTE — Progress Notes (Signed)
Subjective:    Patient ID: Angelica Hess, female    DOB: 1955/12/29, 58 y.o.   MRN: [id number]  HPI  Pt presents to the clinic today for medication refills. She also needs a Tetanus injection and Hepatitis A, B and C titers for her employment. She has no concerns today.  Hypertension: Well controlled on HCTZ and diltiazem.  Hypothyroidism: On Synthroid. Needs TSH checked today. 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  She does have some concern today about a lipoma on her back. She has had this for many years. It has not grown in size but it is painful, especially when she lays down at night to try to sleep. She did have evaluation for surgical removal at one time, but decided not to do it. She would like reevaluation for surgical removal.  Review of Systems      Past Medical History  Diagnosis Date  . Hypertension   . Hypothyroidism   . Genital herpes   . Fibromyalgia   . Sleep apnea     Current Outpatient Prescriptions  Medication Sig Dispense Refill  . diltiazem (CARDIZEM) 120 MG tablet Take 1 tablet (120 mg total) by mouth 2 (two) times daily.  60 tablet  3  . fexofenadine (ALLEGRA) 180 MG tablet Take 180 mg by mouth daily.      . hydrochlorothiazide (HYDRODIURIL) 25 MG tablet Take 25 mg by mouth daily.      Marland Kitchen SYNTHROID 112 MCG tablet Take 1 tablet (112 mcg total) by mouth daily before breakfast.  30 tablet  3  . valACYclovir (VALTREX) 500 MG tablet Take 500 mg by mouth 2 (two) times daily.      . metroNIDAZOLE (METROGEL) 0.86 % gel 1 application. Apply vaginally at bedtime       No current facility-administered medications for this visit.    Allergies  Allergen Reactions  . Penicillins Itching  . Sulfa Antibiotics     Allergy as a child    Family History  Problem Relation Age of Onset  . Alzheimer's disease Mother   . Cancer Father     breast CA    History   Social History  . Marital Status: Married    Spouse  Name: N/A    Number of Children: N/A  . Years of Education: N/A   Occupational History  . Not on file.   Social History Main Topics  . Smoking status: Former Research scientist (life sciences)  . Smokeless tobacco: Never Used  . Alcohol Use: No  . Drug Use: Not on file  . Sexual Activity: Not on file   Other Topics Concern  . Not on file   Social History Narrative   Art therapist.   Married- 7 years.   Has one daughter in 29s, 4 grand children.           Constitutional: Denies fever, malaise, fatigue, headache or abrupt weight changes.  HEENT: Denies eye pain, eye redness, ear pain, ringing in the ears, wax buildup, runny nose, nasal congestion, bloody nose, or sore throat. Respiratory: Denies difficulty breathing, shortness of breath, cough or sputum production.   Cardiovascular: Denies chest pain, chest tightness, palpitations or swelling in the hands or feet.  Gastrointestinal: Denies abdominal pain, bloating, constipation, diarrhea or blood in the stool.  GU: Denies urgency, frequency, pain with urination, burning sensation, blood in urine, odor or discharge. Musculoskeletal: Denies decrease in range of motion, difficulty with gait, muscle pain or joint pain  and swelling.  Skin: Denies redness, rashes, lesions or ulcercations.  Neurological: Denies dizziness, difficulty with memory, difficulty with speech or problems with balance and coordination.   No other specific complaints in a complete review of systems (except as listed in HPI above).  Objective:   Physical Exam  BP 128/84  Pulse 74  Temp(Src) 98.6 F (37 C) (Oral)  Wt 259 lb (117.482 kg)  SpO2 98% Wt Readings from Last 3 Encounters:  11/04/13 259 lb (117.482 kg)  01/14/13 256 lb (116.121 kg)    General: Appears their stated age, well developed, well nourished in NAD. Skin: Warm, dry and intact. No rashes, lesions or ulcerations noted. HEENT: Head: normal shape and size; Eyes: sclera white, no icterus, conjunctiva pink, PERRLA  and EOMs intact; Ears: Tm's gray and intact, normal light reflex; Nose: mucosa pink and moist, septum midline; Throat/Mouth: Teeth present, mucosa pink and moist, no exudate, lesions or ulcerations noted.  Neck: Normal range of motion. Neck supple, trachea midline. No massses, lumps or thyromegaly present.  Cardiovascular: Normal rate and rhythm. S1,S2 noted.  No murmur, rubs or gallops noted. No JVD or BLE edema. No carotid bruits noted. Pulmonary/Chest: Normal effort and positive vesicular breath sounds. No respiratory distress. No wheezes, rales or ronchi noted.  Abdomen: Soft and nontender. Normal bowel sounds, no bruits noted. No distention or masses noted. Liver, spleen and kidneys non palpable. Musculoskeletal: Normal range of motion. No signs of joint swelling. No difficulty with gait.  Neurological: Alert and oriented. Cranial nerves II-XII intact. Coordination normal. +DTRs bilaterally. Psychiatric: Mood and affect normal. Behavior is normal. Judgment and thought content normal.   EKG:  BMET    Component Value Date/Time   NA 140 01/14/2013 1229   K 3.7 01/14/2013 1229   CL 104 01/14/2013 1229   CO2 28 01/14/2013 1229   GLUCOSE 86 01/14/2013 1229   BUN 14 01/14/2013 1229   CREATININE 0.8 01/14/2013 1229   CALCIUM 9.3 01/14/2013 1229   GFRNONAA >60 11/02/2009 1305   GFRAA  Value: >60        The eGFR has been calculated using the MDRD equation. This calculation has not been validated in all clinical situations. eGFR's persistently <60 mL/min signify possible Chronic Kidney Disease. 11/02/2009 1305    Lipid Panel     Component Value Date/Time   CHOL 200 01/14/2013 1229   TRIG 126.0 01/14/2013 1229   HDL 41.20 01/14/2013 1229   CHOLHDL 5 01/14/2013 1229   VLDL 25.2 01/14/2013 1229   LDLCALC 134* 01/14/2013 1229    CBC    Component Value Date/Time   WBC 7.1 11/02/2009 1305   RBC 4.18 11/02/2009 1305   HGB 14.0 11/02/2009 1305   HCT 40.4 11/02/2009 1305   PLT 206 11/02/2009 1305   MCV 96.6  11/02/2009 1305   MCHC 34.6 11/02/2009 1305   RDW 13.1 11/02/2009 1305   LYMPHSABS 1.6 11/02/2009 1305   MONOABS 0.5 11/02/2009 1305   EOSABS 0.0 11/02/2009 1305   BASOSABS 0.0 11/02/2009 1305    Hgb A1C No results found for this basename: HGBA1C         Assessment & Plan:   Lipoma of back:  Will refer to gen surgery

## 2013-11-04 NOTE — Patient Instructions (Addendum)

## 2013-11-04 NOTE — Assessment & Plan Note (Signed)
Well controlled on HCTZ and diltiazem Will check CBC and CMET today Medications refilled per request

## 2013-11-05 LAB — HEPATITIS A ANTIBODY, TOTAL: Hep A Total Ab: NONREACTIVE

## 2013-11-05 LAB — HEPATITIS C ANTIBODY, REFLEX: HCV Ab: NEGATIVE

## 2013-11-05 LAB — HEPATITIS B SURFACE ANTIBODY, QUANTITATIVE: Hepatitis B-Post: 712 m[IU]/mL

## 2013-11-05 LAB — HEPATITIS A ANTIBODY, IGM: Hep A IgM: NONREACTIVE

## 2013-11-14 ENCOUNTER — Telehealth: Payer: Self-pay | Admitting: *Deleted

## 2013-11-14 NOTE — Telephone Encounter (Signed)
Please call pt she would like lab results faxed to her employer, [phone] fax# ( I think, very hard to hear and understand)

## 2013-11-14 NOTE — Telephone Encounter (Signed)
Results have not been reviewed as of yet

## 2013-11-18 ENCOUNTER — Ambulatory Visit (INDEPENDENT_AMBULATORY_CARE_PROVIDER_SITE_OTHER): Payer: No Typology Code available for payment source | Admitting: Surgery

## 2013-11-18 NOTE — Telephone Encounter (Signed)
Gave pt results on TSH--CMP--CBC given by the range--pt is aware and Hep Titer results have been faxed to pt's employer and she is aware

## 2013-11-18 NOTE — Telephone Encounter (Signed)
There are several labs in epic- which ones does she need results for?

## 2013-11-18 NOTE — Telephone Encounter (Signed)
Pt was awaiting lab results--please interpret for me and i will call pt to confirm instructions on faxing labs to employer

## 2013-12-23 ENCOUNTER — Encounter (INDEPENDENT_AMBULATORY_CARE_PROVIDER_SITE_OTHER): Payer: Self-pay | Admitting: General Surgery

## 2013-12-23 ENCOUNTER — Ambulatory Visit (INDEPENDENT_AMBULATORY_CARE_PROVIDER_SITE_OTHER): Payer: No Typology Code available for payment source | Admitting: General Surgery

## 2013-12-23 VITALS — BP 132/86 | HR 66 | Temp 97.4°F | Resp 18 | Ht 62.0 in | Wt 257.6 lb

## 2013-12-23 DIAGNOSIS — D171 Benign lipomatous neoplasm of skin and subcutaneous tissue of trunk: Secondary | ICD-10-CM

## 2013-12-23 DIAGNOSIS — D1779 Benign lipomatous neoplasm of other sites: Secondary | ICD-10-CM

## 2013-12-23 NOTE — Progress Notes (Signed)
Patient ID: Angelica Hess, female   DOB: 1955-10-12, 58 y.o.   MRN: [id number]  Chief Complaint  Patient presents with  . Lipoma    HPI Angelica Hess is a 58 y.o. female.   The patient is a 58 year old female who is referred by Dr. Garnette Gunner for evaluation of a bothersome lipoma. The patient states that over the last 2 months become more bothersome with any activity, as well as lyingin bed and driving. The patient also has a bothersome lipoma on her left anterior chest wall. HPI  Past Medical History  Diagnosis Date  . Hypertension   . Hypothyroidism   . Genital herpes   . Fibromyalgia   . Sleep apnea     Past Surgical History  Procedure Laterality Date  . Cesarean section    . Temporomandibular joint surgery      Family History  Problem Relation Age of Onset  . Alzheimer's disease Mother   . Cancer Father     breast CA    Social History History  Substance Use Topics  . Smoking status: Former Research scientist (life sciences)  . Smokeless tobacco: Never Used  . Alcohol Use: No    Allergies  Allergen Reactions  . Penicillins Itching  . Sulfa Antibiotics     Allergy as a child    Current Outpatient Prescriptions  Medication Sig Dispense Refill  . diltiazem (CARDIZEM) 120 MG tablet Take 1 tablet (120 mg total) by mouth 2 (two) times daily.  60 tablet  3  . fexofenadine (ALLEGRA) 180 MG tablet Take 180 mg by mouth daily.      . hydrochlorothiazide (HYDRODIURIL) 25 MG tablet Take 1 tablet (25 mg total) by mouth daily.  30 tablet  2  . levothyroxine (SYNTHROID, LEVOTHROID) 112 MCG tablet Take 1 tablet (112 mcg total) by mouth daily before breakfast.  30 tablet  2  . metroNIDAZOLE (METROGEL) 2.97 % gel 1 application. Apply vaginally at bedtime      . valACYclovir (VALTREX) 500 MG tablet Take 1 tablet (500 mg total) by mouth 2 (two) times daily.  60 tablet  2   No current facility-administered medications for this visit.    Review of Systems Review of Systems  Constitutional: Negative.   HENT:  Negative.   Respiratory: Negative.   Cardiovascular: Negative.   Gastrointestinal: Negative.   Neurological: Negative.   All other systems reviewed and are negative.   Blood pressure 132/86, pulse 66, temperature 97.4 F (36.3 C), temperature source Temporal, resp. rate 18, height 5\' 2"  (1.575 m), weight 257 lb 9.6 oz (116.847 kg).  Physical Exam Physical Exam  Constitutional: She is oriented to person, place, and time. She appears well-developed and well-nourished.  HENT:  Head: Normocephalic and atraumatic.  Eyes: Conjunctivae and EOM are normal. Pupils are equal, round, and reactive to light.  Neck: Normal range of motion. Neck supple.  Cardiovascular: Normal rate, regular rhythm and normal heart sounds.   Pulmonary/Chest: Effort normal and breath sounds normal.    Abdominal: Soft. Bowel sounds are normal.  Musculoskeletal: Normal range of motion.  Neurological: She is alert and oriented to person, place, and time.  Skin: Skin is warm and dry.     Psychiatric: She has a normal mood and affect.    Data Reviewed none  Assessment    58 year old female with lipoma x2     Plan    1. We'll proceed to the operative and excision of a lipoma 2. I discussed the risks and benefits of the procedure  to include but not limited to infection, bleeding, damage to surrounding structures, nerve pain, the patient voiced understanding and wished to proceed.        Rosario Jacks., Emmanuela Ghazi 12/23/2013, 9:26 AM

## 2014-01-10 ENCOUNTER — Telehealth: Payer: Self-pay

## 2014-01-10 DIAGNOSIS — E039 Hypothyroidism, unspecified: Secondary | ICD-10-CM

## 2014-01-10 MED ORDER — LEVOTHYROXINE SODIUM 112 MCG PO TABS: 112.0000 ug | ORAL_TABLET | Freq: Every day | ORAL | Status: DC

## 2014-01-10 NOTE — Telephone Encounter (Signed)
Pt left v/m; pt received notification from ins co would no longer cover name brand Synthroid but will cover levothyroxine. Pt request new rx sent to [facility] [address].Please advise. ( looks like 11/04/13 was sent as generic ?)

## 2014-01-10 NOTE — Addendum Note (Signed)
Addended by: Lurlean Nanny on: 01/10/2014 01:49 PM   Modules accepted: Orders

## 2014-01-10 NOTE — Telephone Encounter (Signed)
Rx sent through [facility]  

## 2014-01-10 NOTE — Telephone Encounter (Signed)
Mel OK to send in generic.

## 2014-01-31 ENCOUNTER — Telehealth: Payer: Self-pay

## 2014-01-31 ENCOUNTER — Other Ambulatory Visit: Payer: Self-pay

## 2014-01-31 DIAGNOSIS — I10 Essential (primary) hypertension: Secondary | ICD-10-CM

## 2014-01-31 DIAGNOSIS — E039 Hypothyroidism, unspecified: Secondary | ICD-10-CM

## 2014-01-31 MED ORDER — DILTIAZEM HCL 120 MG PO TABS: 120.0000 mg | ORAL_TABLET | Freq: Two times a day (BID) | ORAL | Status: DC

## 2014-01-31 MED ORDER — LEVOTHYROXINE SODIUM 112 MCG PO TABS: 112.0000 ug | ORAL_TABLET | Freq: Every day | ORAL | Status: DC

## 2014-01-31 MED ORDER — HYDROCHLOROTHIAZIDE 25 MG PO TABS: 25.0000 mg | ORAL_TABLET | Freq: Every day | ORAL | Status: DC

## 2014-01-31 NOTE — Telephone Encounter (Signed)
Pt left v/m; pt received letter from [place]; Aurea Graff is going to fax form to be completed by Dr Deborra Medina for HCTZ, Diltiazem and levothyroxine. This form is in reference to coverage of these 3 meds and pt called to make sure form is completed and sent back.

## 2014-02-10 ENCOUNTER — Other Ambulatory Visit: Payer: Self-pay

## 2014-10-06 ENCOUNTER — Encounter: Payer: Self-pay | Admitting: *Deleted

## 2014-10-06 ENCOUNTER — Ambulatory Visit (INDEPENDENT_AMBULATORY_CARE_PROVIDER_SITE_OTHER): Payer: PRIVATE HEALTH INSURANCE | Admitting: Family Medicine

## 2014-10-06 ENCOUNTER — Encounter: Payer: Self-pay | Admitting: Family Medicine

## 2014-10-06 VITALS — BP 144/90 | HR 67 | Temp 97.6°F | Ht 60.25 in | Wt 255.2 lb

## 2014-10-06 DIAGNOSIS — I159 Secondary hypertension, unspecified: Secondary | ICD-10-CM | POA: Diagnosis not present

## 2014-10-06 DIAGNOSIS — E039 Hypothyroidism, unspecified: Secondary | ICD-10-CM

## 2014-10-06 DIAGNOSIS — D179 Benign lipomatous neoplasm, unspecified: Secondary | ICD-10-CM | POA: Diagnosis not present

## 2014-10-06 DIAGNOSIS — Z1239 Encounter for other screening for malignant neoplasm of breast: Secondary | ICD-10-CM

## 2014-10-06 DIAGNOSIS — R5383 Other fatigue: Secondary | ICD-10-CM

## 2014-10-06 DIAGNOSIS — M797 Fibromyalgia: Secondary | ICD-10-CM

## 2014-10-06 DIAGNOSIS — Z0000 Encounter for general adult medical examination without abnormal findings: Secondary | ICD-10-CM | POA: Diagnosis not present

## 2014-10-06 DIAGNOSIS — Z1211 Encounter for screening for malignant neoplasm of colon: Secondary | ICD-10-CM

## 2014-10-06 DIAGNOSIS — Z01419 Encounter for gynecological examination (general) (routine) without abnormal findings: Secondary | ICD-10-CM

## 2014-10-06 LAB — CBC WITH DIFFERENTIAL/PLATELET
Basophils Absolute: 0 K/uL (ref 0.0–0.1)
Basophils Relative: 0.5 % (ref 0.0–3.0)
Eosinophils Absolute: 0.1 K/uL (ref 0.0–0.7)
Eosinophils Relative: 1.6 % (ref 0.0–5.0)
HCT: 40 % (ref 36.0–46.0)
Hemoglobin: 13.4 g/dL (ref 12.0–15.0)
Lymphocytes Relative: 24.6 % (ref 12.0–46.0)
Lymphs Abs: 1.6 K/uL (ref 0.7–4.0)
MCHC: 33.5 g/dL (ref 30.0–36.0)
MCV: 90.1 fl (ref 78.0–100.0)
Monocytes Absolute: 0.4 K/uL (ref 0.1–1.0)
Monocytes Relative: 6.1 % (ref 3.0–12.0)
Neutro Abs: 4.4 K/uL (ref 1.4–7.7)
Neutrophils Relative %: 67.2 % (ref 43.0–77.0)
Platelets: 262 K/uL (ref 150.0–400.0)
RBC: 4.44 Mil/uL (ref 3.87–5.11)
RDW: 13.7 % (ref 11.5–15.5)
WBC: 6.6 K/uL (ref 4.0–10.5)

## 2014-10-06 LAB — T4, FREE: Free T4: 0.87 ng/dL (ref 0.60–1.60)

## 2014-10-06 LAB — LIPID PANEL
Cholesterol: 204 mg/dL — ABNORMAL HIGH (ref 0–200)
HDL: 48.5 mg/dL (ref >39.00)
LDL CALC: 134 mg/dL — AB (ref 0–99)
NONHDL: 155.5
Total CHOL/HDL Ratio: 4
Triglycerides: 110 mg/dL (ref 0.0–149.0)
VLDL: 22 mg/dL (ref 0.0–40.0)

## 2014-10-06 LAB — COMPREHENSIVE METABOLIC PANEL WITH GFR
ALT: 16 U/L (ref 0–35)
AST: 14 U/L (ref 0–37)
Albumin: 3.8 g/dL (ref 3.5–5.2)
Alkaline Phosphatase: 98 U/L (ref 39–117)
BUN: 13 mg/dL (ref 6–23)
CO2: 32 meq/L (ref 19–32)
Calcium: 9.6 mg/dL (ref 8.4–10.5)
Chloride: 102 meq/L (ref 96–112)
Creatinine, Ser: 0.71 mg/dL (ref 0.40–1.20)
GFR: 89.44 mL/min
Glucose, Bld: 88 mg/dL (ref 70–99)
Potassium: 4 meq/L (ref 3.5–5.1)
Sodium: 139 meq/L (ref 135–145)
Total Bilirubin: 0.4 mg/dL (ref 0.2–1.2)
Total Protein: 6.8 g/dL (ref 6.0–8.3)

## 2014-10-06 LAB — TSH: TSH: 1.87 u[IU]/mL (ref 0.35–4.50)

## 2014-10-06 LAB — VITAMIN D 25 HYDROXY (VIT D DEFICIENCY, FRACTURES): VITD: 12.47 ng/mL — ABNORMAL LOW (ref 30.00–100.00)

## 2014-10-06 LAB — VITAMIN B12: Vitamin B-12: 202 pg/mL — ABNORMAL LOW (ref 211–911)

## 2014-10-06 MED ORDER — ACYCLOVIR 5 % EX CREA: 1.0000 "application " | TOPICAL_CREAM | CUTANEOUS | Status: DC

## 2014-10-06 NOTE — Assessment & Plan Note (Signed)
Continue current dose of synthroid. Recheck labs today.

## 2014-10-06 NOTE — Assessment & Plan Note (Signed)
Mildly elevated but has not had rx today. Asymptomatic, advised to take rx when she gets home.

## 2014-10-06 NOTE — Assessment & Plan Note (Signed)
Continue water aerobics. Letter written for pt stating she can use handicap entrance at pool since she has a hard time walking up the stairs due to her knees and body habitus.

## 2014-10-06 NOTE — Progress Notes (Signed)
Subjective:   Patient ID: Angelica Hess, female    DOB: Apr 05, 1956, 59 y.o.   MRN: [id number]  Angelica Hess is a pleasant 59 y.o. year old female who presents to clinic today with Annual Exam and to follow up chronic medical conditions on 10/06/2014  HPI:  Due for mammogram. Last pap smear 01/14/13, done by me.  Has not had one since.  She does want referral to GYN for "complete GYN exam." Mammogram- 02/04/13  HTN- BP elevated today.  Has been taking HCTZ, diltiazem but has not taken any of her medications today.  BP Readings from Last 3 Encounters:  10/06/14 144/90  12/23/13 132/86  11/04/13 128/84   Fatigue- has been "exhausted" lately.  Sleeping about the same as always- husbands snoring wakes her up multiple times a night.   Denies SOB or blood in her stool. Sometimes a lipoma on her chest wall can ache but does not have "chest pain."  Does have hypothyroidism on replacement- takes synthroid 112 mcg daily. Denies any other symptoms of hypo or hyperthyroidism.  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  Increasing in number since high school.  Becoming bothersome now.  Per pt, has never had one removed or biopsied but it appears that when she saw surgeon, Dr. Rosendo Hess, last August, she had agreed to an excision at that time (note reviewed in [facility].  Fibromyalgia- Has been taking water aerobics which has helped but still "in pain all the time."  Would prefer not to take rxs for this given all the side effects they can cause.  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 Has run out of her valtrex and zovirax cream. Current Outpatient Prescriptions on File Prior to Visit  Medication Sig Dispense Refill  . diltiazem (CARDIZEM) 120 MG tablet Take 1 tablet (120 mg total) by mouth 2 (two) times daily. 180 tablet 1  . fexofenadine (ALLEGRA) 180 MG tablet Take 180 mg by mouth daily.    . hydrochlorothiazide (HYDRODIURIL) 25 MG tablet Take 1 tablet (25 mg total) by mouth  daily. 90 tablet 1  . levothyroxine (SYNTHROID, LEVOTHROID) 112 MCG tablet Take 1 tablet (112 mcg total) by mouth daily before breakfast. 90 tablet 1  . metroNIDAZOLE (METROGEL) 7.67 % gel 1 application. Apply vaginally at bedtime    . valACYclovir (VALTREX) 500 MG tablet Take 1 tablet (500 mg total) by mouth 2 (two) times daily. 60 tablet 2   No current facility-administered medications on file prior to visit.    Allergies  Allergen Reactions  . Penicillins Itching  . Sulfa Antibiotics     Allergy as a child    Past Medical History  Diagnosis Date  . Hypertension   . Hypothyroidism   . Genital herpes   . Fibromyalgia   . Sleep apnea     Past Surgical History  Procedure Laterality Date  . Cesarean section    . Temporomandibular joint surgery      Family History  Problem Relation Age of Onset  . Alzheimer's disease Mother   . Cancer Father     breast CA    History   Social History  . Marital Status: Married    Spouse Name: Angelica Hess  . Number of Children: Angelica Hess  . Years of Education: Angelica Hess   Occupational History  . Not on file.   Social History Main Topics  . Smoking status: Former Research scientist (life sciences)  . Smokeless tobacco: Never Used  . Alcohol Use: No  .  Drug Use: Not on file  . Sexual Activity: Not on file   Other Topics Concern  . Not on file   Social History Narrative   Art therapist.   Married- 7 years.   Has one daughter in 71s, 4 grand children.         The PMH, PSH, Social History, Family History, Medications, and allergies have been reviewed in [facility], and have been updated if relevant.   Review of Systems  Constitutional: Positive for fatigue. Negative for fever, appetite change and unexpected weight change.  HENT: Negative.   Eyes: Negative.   Respiratory: Negative for shortness of breath.   Cardiovascular: Negative.   Gastrointestinal: Negative.   Endocrine: Negative.   Genitourinary: Negative.   Musculoskeletal: Positive for myalgias, back pain, joint  swelling and arthralgias.  Skin: Negative.   Allergic/Immunologic: Negative.   Neurological: Negative.   Hematological: Negative.   Psychiatric/Behavioral: Negative.        Objective:    BP 144/90 mmHg  Pulse 67  Temp(Src) 97.6 F (36.4 C) (Oral)  Ht 5' 0.25" (1.53 m)  Wt 255 lb 4 oz (115.781 kg)  BMI 49.46 kg/m2  SpO2 98%  Wt Readings from Last 3 Encounters:  10/06/14 255 lb 4 oz (115.781 kg)  12/23/13 257 lb 9.6 oz (116.847 kg)  11/04/13 259 lb (117.482 kg)    Physical Exam  Constitutional: She is oriented to person, place, and time. She appears well-developed and well-nourished. No distress.  Morbidly obese  HENT:  Head: Normocephalic.  Eyes: Conjunctivae are normal.  Neck: Normal range of motion.  Cardiovascular: Normal rate and regular rhythm.   Pulmonary/Chest: Effort normal and breath sounds normal. No respiratory distress. She has no wheezes. She has no rales. She exhibits no tenderness.  Abdominal: Soft. Bowel sounds are normal.  Musculoskeletal: She exhibits no edema.  Neurological: She is alert and oriented to person, place, and time. No cranial nerve deficit.  Skin: Skin is warm and dry.  Multiple SKs Innumerable subcutaneous masses- arms, legs, back, neck- freely movable- does seem consistent with lipomas  Psychiatric: She has a normal mood and affect. Her behavior is normal. Thought content normal.  Nursing note and vitals reviewed.         Assessment & Plan:   Well woman exam  Severe obesity (BMI >= 40)  Secondary hypertension, unspecified  Fibromyalgia  Hypothyroidism, unspecified hypothyroidism type No Follow-up on file.

## 2014-10-06 NOTE — Assessment & Plan Note (Signed)
Reviewed preventive care protocols, scheduled due services, and updated immunizations Discussed nutrition, exercise, diet, and healthy lifestyle.  Refer to GYN for pap smear as pt requests.  Refer to GI for colonoscopy.  Mammogram ordered and number given to pt for breast center.  She will call to schedule appointment.  Orders Placed This Encounter  Procedures  . MM Digital Screening  . CBC with Differential/Platelet  . Comprehensive metabolic panel  . Lipid panel  . TSH  . T4, Free  . Vitamin B12  . Vitamin D, 25-hydroxy  . Ambulatory referral to Gynecology  . Ambulatory referral to General Surgery  . Ambulatory referral to Gastroenterology

## 2014-10-06 NOTE — Patient Instructions (Signed)
Great to see you. We are referring you to a general surgeon, a gynecologist and a gastroenterologist (for a colonoscopy).  Please call the [facility] to schedule your mammogram.  We will call you with your lab results and you can view them online.

## 2014-10-06 NOTE — Assessment & Plan Note (Signed)
Deteriorated. Explained to pt that the only way to truly know what they are is to see a general surgeon to have one excised and biopsied.  She agrees to this. Referral placed.

## 2014-10-06 NOTE — Progress Notes (Signed)
Pre visit review using our clinic review tool, if applicable. No additional management support is needed unless otherwise documented below in the visit note. 

## 2014-10-06 NOTE — Assessment & Plan Note (Signed)
New- likely multifactorial. Start initial work up with labs. The patient indicates understanding of these issues and agrees with the plan.

## 2014-10-07 MED ORDER — VITAMIN D (ERGOCALCIFEROL) 1.25 MG (50000 UNIT) PO CAPS: 50000.0000 [IU] | ORAL_CAPSULE | ORAL | Status: DC

## 2014-10-07 NOTE — Addendum Note (Signed)
Addended by: Modena Nunnery on: 10/07/2014 03:52 PM   Modules accepted: Orders

## 2014-10-13 ENCOUNTER — Encounter: Payer: Self-pay | Admitting: General Surgery

## 2014-10-13 ENCOUNTER — Ambulatory Visit (INDEPENDENT_AMBULATORY_CARE_PROVIDER_SITE_OTHER): Payer: PRIVATE HEALTH INSURANCE | Admitting: General Surgery

## 2014-10-13 VITALS — Ht 61.0 in | Wt 255.0 lb

## 2014-10-13 DIAGNOSIS — D179 Benign lipomatous neoplasm, unspecified: Secondary | ICD-10-CM

## 2014-10-13 NOTE — Progress Notes (Signed)
Patient ID: Angelica Hess, female   DOB: 01-22-1956, 59 y.o.   MRN: [id number]  Chief Complaint  Patient presents with  . Lipoma    HPI Angelica Hess is a 59 y.o. female here today for a evaluation of multiple lipomas. She states that some of them are starting to bother her.   HPI  Past Medical History  Diagnosis Date  . Hypertension   . Hypothyroidism   . Genital herpes   . Fibromyalgia   . Sleep apnea     Past Surgical History  Procedure Laterality Date  . Cesarean section    . Temporomandibular joint surgery      Family History  Problem Relation Age of Onset  . Alzheimer's disease Mother   . Cancer Father     breast CA  . Breast cancer Mother 72    Social History History  Substance Use Topics  . Smoking status: Former Research scientist (life sciences)  . Smokeless tobacco: Never Used  . Alcohol Use: No    Allergies  Allergen Reactions  . Penicillins Itching  . Sulfa Antibiotics     Allergy as a child    Current Outpatient Prescriptions  Medication Sig Dispense Refill  . acyclovir cream (ZOVIRAX) 5 % Apply 1 application topically every 3 (three) hours. 15 g 0  . diltiazem (CARDIZEM) 120 MG tablet Take 1 tablet (120 mg total) by mouth 2 (two) times daily. 180 tablet 1  . fexofenadine (ALLEGRA) 180 MG tablet Take 180 mg by mouth daily.    . hydrochlorothiazide (HYDRODIURIL) 25 MG tablet Take 1 tablet (25 mg total) by mouth daily. 90 tablet 1  . levothyroxine (SYNTHROID, LEVOTHROID) 112 MCG tablet Take 1 tablet (112 mcg total) by mouth daily before breakfast. 90 tablet 1  . metroNIDAZOLE (METROGEL) 3.33 % gel 1 application. Apply vaginally at bedtime    . valACYclovir (VALTREX) 500 MG tablet Take 1 tablet (500 mg total) by mouth 2 (two) times daily. 60 tablet 2  . Vitamin D, Ergocalciferol, (DRISDOL) 50000 UNITS CAPS capsule Take 1 capsule (50,000 Units total) by mouth every 7 (seven) days. 6 capsule 0   No current facility-administered medications for this visit.    Review of  Systems Review of Systems  Constitutional: Negative.   Respiratory: Negative.   Cardiovascular: Negative.     Height 5\' 1"  (1.549 m), weight 255 lb (115.667 kg).  Physical Exam Physical Exam  Constitutional: She is oriented to person, place, and time. She appears well-developed and well-nourished.  Eyes: Conjunctivae are normal. No scleral icterus.  Neck: Neck supple.  Cardiovascular: Normal rate, regular rhythm and normal heart sounds.   Pulmonary/Chest: Effort normal and breath sounds normal.  Lymphadenopathy:    She has no cervical adenopathy.  Neurological: She is alert and oriented to person, place, and time.  Skin: Skin is warm and dry.      Left medial elbow 4-5 cm lipoma.    The patient has dozens of lipomas involving the upper extremities, trunk and lower extremities.  Data Reviewed PCP notes.  Assessment    Symptomatic lipoma. History of fibromyalgia.    Plan    The patient questioned whether weight loss would result in dissolution of the lipomas. The contrary is the normal pattern as the adipose tissue were seeds the lipomas become more evident.  The area of the right back and left medial elbow her most symptomatic at present and can be excised under local anesthesia to relieve discomfort she is experiencing with pressure in these areas.  PCP:  Geoffery Lyons 10/14/2014, 6:11 PM

## 2014-10-27 ENCOUNTER — Ambulatory Visit: Payer: No Typology Code available for payment source | Admitting: General Surgery

## 2014-11-10 ENCOUNTER — Other Ambulatory Visit: Payer: Self-pay | Admitting: Internal Medicine

## 2014-11-11 NOTE — Telephone Encounter (Signed)
Last filled 10/2013--please advise

## 2014-11-12 ENCOUNTER — Encounter: Payer: Self-pay | Admitting: *Deleted

## 2015-02-14 ENCOUNTER — Other Ambulatory Visit: Payer: Self-pay | Admitting: Internal Medicine

## 2015-12-01 ENCOUNTER — Other Ambulatory Visit: Payer: Self-pay | Admitting: Family Medicine

## 2015-12-01 DIAGNOSIS — E039 Hypothyroidism, unspecified: Secondary | ICD-10-CM

## 2015-12-01 DIAGNOSIS — Z01419 Encounter for gynecological examination (general) (routine) without abnormal findings: Secondary | ICD-10-CM

## 2015-12-01 DIAGNOSIS — I159 Secondary hypertension, unspecified: Secondary | ICD-10-CM

## 2015-12-03 ENCOUNTER — Other Ambulatory Visit (INDEPENDENT_AMBULATORY_CARE_PROVIDER_SITE_OTHER): Payer: No Typology Code available for payment source

## 2015-12-03 DIAGNOSIS — Z01419 Encounter for gynecological examination (general) (routine) without abnormal findings: Secondary | ICD-10-CM

## 2015-12-03 DIAGNOSIS — Z0000 Encounter for general adult medical examination without abnormal findings: Secondary | ICD-10-CM | POA: Diagnosis not present

## 2015-12-03 DIAGNOSIS — E039 Hypothyroidism, unspecified: Secondary | ICD-10-CM

## 2015-12-04 ENCOUNTER — Other Ambulatory Visit: Payer: PRIVATE HEALTH INSURANCE

## 2015-12-04 LAB — CBC WITH DIFFERENTIAL/PLATELET
Basophils Absolute: 0.1 10*3/uL (ref 0.0–0.1)
Basophils Relative: 0.6 % (ref 0.0–3.0)
EOS ABS: 0.1 10*3/uL (ref 0.0–0.7)
Eosinophils Relative: 1.7 % (ref 0.0–5.0)
HCT: 39 % (ref 36.0–46.0)
HEMOGLOBIN: 12.9 g/dL (ref 12.0–15.0)
LYMPHS ABS: 1.5 10*3/uL (ref 0.7–4.0)
Lymphocytes Relative: 17.9 % (ref 12.0–46.0)
MCHC: 33.2 g/dL (ref 30.0–36.0)
MCV: 91 fl (ref 78.0–100.0)
Monocytes Absolute: 0.5 10*3/uL (ref 0.1–1.0)
Monocytes Relative: 5.8 % (ref 3.0–12.0)
NEUTROS PCT: 74 % (ref 43.0–77.0)
Neutro Abs: 6.2 10*3/uL (ref 1.4–7.7)
Platelets: 229 10*3/uL (ref 150.0–400.0)
RBC: 4.28 Mil/uL (ref 3.87–5.11)
RDW: 14 % (ref 11.5–15.5)
WBC: 8.3 10*3/uL (ref 4.0–10.5)

## 2015-12-04 LAB — LIPID PANEL
CHOL/HDL RATIO: 4
Cholesterol: 187 mg/dL (ref 0–200)
HDL: 47 mg/dL (ref >39.00)
LDL Cholesterol: 118 mg/dL — ABNORMAL HIGH (ref 0–99)
NONHDL: 139.55
TRIGLYCERIDES: 106 mg/dL (ref 0.0–149.0)
VLDL: 21.2 mg/dL (ref 0.0–40.0)

## 2015-12-04 LAB — T4, FREE: FREE T4: 0.85 ng/dL (ref 0.60–1.60)

## 2015-12-04 LAB — COMPREHENSIVE METABOLIC PANEL
ALT: 13 U/L (ref 0–35)
AST: 14 U/L (ref 0–37)
Albumin: 4.2 g/dL (ref 3.5–5.2)
Alkaline Phosphatase: 98 U/L (ref 39–117)
BILIRUBIN TOTAL: 0.3 mg/dL (ref 0.2–1.2)
BUN: 20 mg/dL (ref 6–23)
CALCIUM: 9.7 mg/dL (ref 8.4–10.5)
CHLORIDE: 105 meq/L (ref 96–112)
CO2: 28 meq/L (ref 19–32)
CREATININE: 0.86 mg/dL (ref 0.40–1.20)
GFR: 71.41 mL/min (ref >60.00)
GLUCOSE: 82 mg/dL (ref 70–99)
Potassium: 3.8 mEq/L (ref 3.5–5.1)
Sodium: 142 mEq/L (ref 135–145)
Total Protein: 7.2 g/dL (ref 6.0–8.3)

## 2015-12-04 LAB — TSH: TSH: 3.8 u[IU]/mL (ref 0.35–4.50)

## 2015-12-07 ENCOUNTER — Encounter: Payer: Self-pay | Admitting: Family Medicine

## 2015-12-07 ENCOUNTER — Ambulatory Visit (INDEPENDENT_AMBULATORY_CARE_PROVIDER_SITE_OTHER): Payer: No Typology Code available for payment source | Admitting: Family Medicine

## 2015-12-07 ENCOUNTER — Other Ambulatory Visit (HOSPITAL_COMMUNITY)
Admission: RE | Admit: 2015-12-07 | Discharge: 2015-12-07 | Disposition: A | Discharge status: Home / Self Care | Payer: No Typology Code available for payment source | Source: Ambulatory Visit | Attending: Family Medicine | Admitting: Family Medicine

## 2015-12-07 VITALS — BP 134/92 | HR 76 | Temp 98.1°F | Ht 60.25 in | Wt 257.8 lb

## 2015-12-07 DIAGNOSIS — Z23 Encounter for immunization: Secondary | ICD-10-CM | POA: Diagnosis not present

## 2015-12-07 DIAGNOSIS — Z1239 Encounter for other screening for malignant neoplasm of breast: Secondary | ICD-10-CM

## 2015-12-07 DIAGNOSIS — I159 Secondary hypertension, unspecified: Secondary | ICD-10-CM

## 2015-12-07 DIAGNOSIS — Z1211 Encounter for screening for malignant neoplasm of colon: Secondary | ICD-10-CM

## 2015-12-07 DIAGNOSIS — Z01419 Encounter for gynecological examination (general) (routine) without abnormal findings: Secondary | ICD-10-CM

## 2015-12-07 DIAGNOSIS — E039 Hypothyroidism, unspecified: Secondary | ICD-10-CM | POA: Diagnosis not present

## 2015-12-07 DIAGNOSIS — Z0000 Encounter for general adult medical examination without abnormal findings: Secondary | ICD-10-CM | POA: Diagnosis not present

## 2015-12-07 MED ORDER — ACYCLOVIR 5 % EX CREA: 1.0000 "application " | TOPICAL_CREAM | CUTANEOUS | Status: DC

## 2015-12-07 MED ORDER — VALACYCLOVIR HCL 500 MG PO TABS: ORAL_TABLET | ORAL | Status: DC

## 2015-12-07 NOTE — Assessment & Plan Note (Signed)
Reviewed preventive care protocols, scheduled due services, and updated immunizations Discussed nutrition, exercise, diet, and healthy lifestyle.  Mammogram ordered. GI referral placed for colonoscopy. Pt to call insurance company about coverage for zostavax.  Pap smear done today.

## 2015-12-07 NOTE — Assessment & Plan Note (Signed)
Reasonable control without daily rx. No changes made today.

## 2015-12-07 NOTE — Progress Notes (Signed)
Subjective:   Patient ID: Angelica Hess, female    DOB: 07-13-1955, 60 y.o.   MRN: [id number]  [place] is a pleasant 60 y.o. year old female who presents to clinic today with Annual Exam and to follow up chronic medical conditions on 12/07/2015  HPI:  Due for mammogram. Last pap smear 01/14/13, done by me.  Has not had one since.   Mammogram- 02/04/13  HTN- has only been taking HCTZ as needed and BP has been controlled.  Due for colonoscopy.   BP Readings from Last 3 Encounters:  12/07/15 134/92  10/06/14 144/90  12/23/13 132/86   Lab Results  Component Value Date   CHOL 187 12/03/2015   HDL 47.00 12/03/2015   LDLCALC 118* 12/03/2015   TRIG 106.0 12/03/2015   CHOLHDL 4 12/03/2015   Lab Results  Component Value Date   CREATININE 0.86 12/03/2015   Lab Results  Component Value Date   WBC 8.3 12/03/2015   HGB 12.9 12/03/2015   HCT 39.0 12/03/2015   MCV 91.0 12/03/2015   PLT 229.0 12/03/2015    Hypothyroidism- takes synthroid 112 mcg daily. Denies any other symptoms of hypo or hyperthyroidism. Lab Results  Component Value Date   TSH 3.80 12/03/2015    Fibromyalgia- Would prefer not to take rxs for this given all the side effects they can cause.  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 Has run out of her valtrex and zovirax cream. Current Outpatient Prescriptions on File Prior to Visit  Medication Sig Dispense Refill  . fexofenadine (ALLEGRA) 180 MG tablet Take 180 mg by mouth daily.    Marland Kitchen SYNTHROID 112 MCG tablet TAKE ONE TABLET BY MOUTH ONCE DAILY 30 tablet 7   No current facility-administered medications on file prior to visit.    Allergies  Allergen Reactions  . Penicillins Itching  . Sulfa Antibiotics     Allergy as a child    Past Medical History  Diagnosis Date  . Hypertension   . Hypothyroidism   . Genital herpes   . Fibromyalgia   . Sleep apnea     Past Surgical History  Procedure Laterality Date  . Cesarean section    .  Temporomandibular joint surgery      Family History  Problem Relation Age of Onset  . Alzheimer's disease Mother   . Cancer Father     breast CA  . Breast cancer Mother 3    Social History   Social History  . Marital Status: Married    Spouse Name: N/A  . Number of Children: N/A  . Years of Education: N/A   Occupational History  . Not on file.   Social History Main Topics  . Smoking status: Former Research scientist (life sciences)  . Smokeless tobacco: Never Used  . Alcohol Use: No  . Drug Use: Not on file  . Sexual Activity: Not on file   Other Topics Concern  . Not on file   Social History Narrative   Art therapist.   Married- 7 years.   Has one daughter in 86s, 4 grand children.         The PMH, PSH, Social History, Family History, Medications, and allergies have been reviewed in [facility], and have been updated if relevant.   Review of Systems  Constitutional: Positive for fatigue. Negative for fever, appetite change and unexpected weight change.  HENT: Negative.   Eyes: Negative.   Respiratory: Negative for shortness of breath.   Cardiovascular: Negative.   Gastrointestinal: Negative.  Endocrine: Negative.   Genitourinary: Negative.   Musculoskeletal: Positive for myalgias, back pain, joint swelling and arthralgias.  Skin: Negative.   Allergic/Immunologic: Negative.   Neurological: Negative.   Hematological: Negative.   Psychiatric/Behavioral: Negative.        Objective:    BP 134/92 mmHg  Pulse 76  Temp(Src) 98.1 F (36.7 C) (Oral)  Ht 5' 0.25" (1.53 m)  Wt 257 lb 12 oz (116.915 kg)  BMI 49.94 kg/m2  SpO2 98%  Wt Readings from Last 3 Encounters:  12/07/15 257 lb 12 oz (116.915 kg)  10/13/14 255 lb (115.667 kg)  10/06/14 255 lb 4 oz (115.781 kg)    Physical Exam  Constitutional: She is oriented to person, place, and time. She appears well-developed and well-nourished. No distress.  Morbidly obese  HENT:  Head: Normocephalic.  Eyes: Conjunctivae are normal.    Neck: Normal range of motion.  Cardiovascular: Normal rate and regular rhythm.   Pulmonary/Chest: Effort normal and breath sounds normal. No respiratory distress. She has no wheezes. She has no rales. She exhibits no tenderness.  Abdominal: Soft. Bowel sounds are normal.  Genitourinary:   Rectal:  no external abnormalities.   Genitalia:  Pelvic Exam:        External: normal female genitalia without lesions or masses        Vagina: normal without lesions or masses        Cervix: normal without lesions or masses        Adnexa: normal bimanual exam without masses or fullness        Uterus: normal by palpation        Pap smear: performed       Musculoskeletal: She exhibits no edema.  Neurological: She is alert and oriented to person, place, and time. No cranial nerve deficit.  Skin: Skin is warm and dry.  Multiple SKs Multiple lipomas   Psychiatric: She has a normal mood and affect. Her behavior is normal. Thought content normal.  Nursing note and vitals reviewed.         Assessment & Plan:   Well woman exam  Morbid obesity, unspecified obesity type ([place])  Secondary hypertension, unspecified  Hypothyroidism, unspecified hypothyroidism type No Follow-up on file.

## 2015-12-07 NOTE — Addendum Note (Signed)
Addended by: Modena Nunnery on: 12/07/2015 11:35 AM   Modules accepted: Orders, SmartSet

## 2015-12-07 NOTE — Addendum Note (Signed)
Addended by: Modena Nunnery on: 12/07/2015 12:15 PM   Modules accepted: Orders, SmartSet

## 2015-12-07 NOTE — Patient Instructions (Signed)
Great to see you.  Check with your insurance to see if they will cover the shingles shot.  Please call to schedule your mammogram.  Please stop by to see Rosaria Ferries on your way out.

## 2015-12-07 NOTE — Assessment & Plan Note (Signed)
Well controlled on current rx. No changes made today. 

## 2015-12-08 LAB — CYTOLOGY - PAP

## 2015-12-09 ENCOUNTER — Encounter: Payer: Self-pay | Admitting: *Deleted

## 2015-12-15 ENCOUNTER — Ambulatory Visit
Admission: RE | Admit: 2015-12-15 | Discharge: 2015-12-15 | Disposition: A | Discharge status: Home / Self Care | Payer: No Typology Code available for payment source | Source: Ambulatory Visit | Attending: Family Medicine | Admitting: Family Medicine

## 2015-12-15 DIAGNOSIS — Z1239 Encounter for other screening for malignant neoplasm of breast: Secondary | ICD-10-CM

## 2016-02-01 ENCOUNTER — Encounter: Payer: Self-pay | Admitting: Family Medicine

## 2016-03-05 ENCOUNTER — Other Ambulatory Visit: Payer: Self-pay | Admitting: Internal Medicine

## 2016-05-17 ENCOUNTER — Telehealth: Payer: Self-pay | Admitting: *Deleted

## 2016-05-17 NOTE — Telephone Encounter (Signed)
Fax received from [facility] [employer] [employer].  Levothyroxine 112 mcg.  Brand patient has been on isn't available.  Can we use what we have?

## 2016-05-17 NOTE — Telephone Encounter (Signed)
Yes ok to use.  Please schedule TSH, FT4 in 4-8 weeks.

## 2016-05-18 NOTE — Telephone Encounter (Signed)
Lm on [facility] vm and advised ok to switch brands. LM on pts vm requesting she contact office and schedule f/u labs

## 2018-02-26 ENCOUNTER — Encounter: Payer: No Typology Code available for payment source | Admitting: Internal Medicine

## 2018-03-05 ENCOUNTER — Encounter: Payer: No Typology Code available for payment source | Admitting: Internal Medicine

## 2018-04-16 ENCOUNTER — Encounter: Payer: No Typology Code available for payment source | Admitting: Internal Medicine

## 2018-04-23 ENCOUNTER — Ambulatory Visit (INDEPENDENT_AMBULATORY_CARE_PROVIDER_SITE_OTHER): Payer: 59 | Admitting: Internal Medicine

## 2018-04-23 ENCOUNTER — Encounter: Payer: Self-pay | Admitting: Internal Medicine

## 2018-04-23 ENCOUNTER — Other Ambulatory Visit: Payer: Self-pay | Admitting: Internal Medicine

## 2018-04-23 VITALS — BP 136/90 | HR 70 | Temp 98.1°F | Wt 265.0 lb

## 2018-04-23 DIAGNOSIS — M797 Fibromyalgia: Secondary | ICD-10-CM | POA: Diagnosis not present

## 2018-04-23 DIAGNOSIS — A6004 Herpesviral vulvovaginitis: Secondary | ICD-10-CM | POA: Diagnosis not present

## 2018-04-23 DIAGNOSIS — I10 Essential (primary) hypertension: Secondary | ICD-10-CM | POA: Diagnosis not present

## 2018-04-23 DIAGNOSIS — E890 Postprocedural hypothyroidism: Secondary | ICD-10-CM | POA: Diagnosis not present

## 2018-04-23 DIAGNOSIS — G4733 Obstructive sleep apnea (adult) (pediatric): Secondary | ICD-10-CM

## 2018-04-23 LAB — T4, FREE: Free T4: 0.51 ng/dL — ABNORMAL LOW (ref 0.60–1.60)

## 2018-04-23 LAB — TSH: TSH: 7.63 u[IU]/mL — ABNORMAL HIGH (ref 0.35–4.50)

## 2018-04-23 MED ORDER — VALACYCLOVIR HCL 500 MG PO TABS: ORAL_TABLET | ORAL | 5 refills | Status: DC

## 2018-04-23 MED ORDER — OLMESARTAN MEDOXOMIL 5 MG PO TABS: 5.0000 mg | ORAL_TABLET | Freq: Every day | ORAL | 0 refills | Status: DC

## 2018-04-23 MED ORDER — LEVOTHYROXINE SODIUM 50 MCG PO TABS: 50.0000 ug | ORAL_TABLET | Freq: Every day | ORAL | 0 refills | Status: DC

## 2018-04-23 NOTE — Assessment & Plan Note (Signed)
Uncontrolled Will start Olmesartan Reinforced DASH diet and exercise for weight loss  RTC in 3 weeks for HTN Follow Up/annual exam

## 2018-04-23 NOTE — Assessment & Plan Note (Signed)
Not wearing CPAP Declines referral for pulmonology at this time

## 2018-04-23 NOTE — Patient Instructions (Signed)

## 2018-04-23 NOTE — Progress Notes (Signed)
HPI  Pt presents to the clinic today to establish care and for management of the conditions listed below. She is transferring care from Dr. Deborra Medina.  Fibromyalgia: She reports her joint and muscle pain are much better since she started taking Communion. She is not taking any medications OTC for pain.  Genital Herpes: Her last outbreak was a few months ago. She takes Valtrex as needed during flares.  HTN: Her BP today is 136/90. She used to take Cardizem and HCTZ, but stopped taking it when she ran out of insurance. She had a cough with Lisinopril. There is no ECG on file.   Hypothyroidism: Her levels were last checked 11/2015. She has been out of her Levothyroxine for a few years. She is feeling fatigued, weight gain, feeling cold, and constipation.   OSA: She does not wear a CPAP. She reports she did the sleep study more than 5 years ago, but never got the machine. She does wake up feeling tired. She does not nap during the day. She does snore.  Flu: 01/2018 Tetanus: 10/2013 Zostovax: 11/2015 Shingrix: never Mammogram: 11/2015 Pap Smear: 11/2015 Bone Density: never Colon Screening: ? 2007, [facility] Screening: as needed Dentist: as needed  Past Medical History:  Diagnosis Date  . Fibromyalgia   . Genital herpes   . Hypertension   . Hypothyroidism   . Sleep apnea     Current Outpatient Medications  Medication Sig Dispense Refill  . acyclovir cream (ZOVIRAX) 5 % Apply 1 application topically every 3 (three) hours. 15 g 2  . fexofenadine (ALLEGRA) 180 MG tablet Take 180 mg by mouth daily.    Marland Kitchen levothyroxine (SYNTHROID, LEVOTHROID) 112 MCG tablet TAKE ONE TABLET BY MOUTH ONCE DAILY 30 tablet 2  . valACYclovir (VALTREX) 500 MG tablet TAKE ONE CAPLET BY MOUTH TWICE DAILY 60 tablet 5   No current facility-administered medications for this visit.     Allergies  Allergen Reactions  . Penicillins Itching  . Sulfa Antibiotics     Allergy as a child    Family History  Problem  Relation Age of Onset  . Alzheimer's disease Mother   . Cancer Father        breast CA  . Breast cancer Mother 52    Social History   Socioeconomic History  . Marital status: Married    Spouse name: Not on file  . Number of children: Not on file  . Years of education: Not on file  . Highest education level: Not on file  Occupational History  . Not on file  Social Needs  . Financial resource strain: Not on file  . Food insecurity:    Worry: Not on file    Inability: Not on file  . Transportation needs:    Medical: Not on file    Non-medical: Not on file  Tobacco Use  . Smoking status: Former Research scientist (life sciences)  . Smokeless tobacco: Never Used  Substance and Sexual Activity  . Alcohol use: No  . Drug use: Not on file  . Sexual activity: Not on file  Lifestyle  . Physical activity:    Days per week: Not on file    Minutes per session: Not on file  . Stress: Not on file  Relationships  . Social connections:    Talks on phone: Not on file    Gets together: Not on file    Attends religious service: Not on file    Active member of club or organization: Not on file  Attends meetings of clubs or organizations: Not on file    Relationship status: Not on file  . Intimate partner violence:    Fear of current or ex partner: Not on file    Emotionally abused: Not on file    Physically abused: Not on file    Forced sexual activity: Not on file  Other Topics Concern  . Not on file  Social History Narrative   Art therapist.   Married- 7 years.   Has one daughter in 64s, 4 grand children.          ROS:  Constitutional: Pt reports fatigue, weight gain. Denies fever, malaise, headache.  HEENT: Pt reports dry mouth. Denies eye pain, eye redness, ear pain, ringing in the ears, wax buildup, runny nose, nasal congestion, bloody nose, or sore throat. Respiratory: Denies difficulty breathing, shortness of breath, cough or sputum production.   Cardiovascular: Denies chest pain, chest  tightness, palpitations or swelling in the hands or feet.  Gastrointestinal: Pt reports constipation. Denies abdominal pain, bloating, diarrhea or blood in the stool.  GU: Pt reports urge incontinence. Denies frequency, urgency, pain with urination, blood in urine, odor or discharge. Musculoskeletal: Denies decrease in range of motion, difficulty with gait, muscle pain or joint pain and swelling.  Skin: Denies redness, rashes, lesions or ulcercations.  Neurological: Denies dizziness, difficulty with memory, difficulty with speech or problems with balance and coordination.  Psych: Denies anxiety, depression, SI/HI.  No other specific complaints in a complete review of systems (except as listed in HPI above).  PE:  BP 136/90   Pulse 70   Temp 98.1 F (36.7 C) (Oral)   Wt 265 lb (120.2 kg)   BMI 51.33 kg/m   Wt Readings from Last 3 Encounters:  12/07/15 257 lb 12 oz (116.9 kg)  10/13/14 255 lb (115.7 kg)  10/06/14 255 lb 4 oz (115.8 kg)    General: Appears her stated age, obese, in NAD. Neck: Neck supple, trachea midline. No masses, lumps or thyromegaly present.  Cardiovascular: Normal rate and rhythm. S1,S2 noted.  No murmur, rubs or gallops noted. No JVD or BLE edema. Pulmonary/Chest: Normal effort and positive vesicular breath sounds. No respiratory distress. No wheezes, rales or ronchi noted.  Abdomen: Soft and nontender. Normal bowel sounds. Musculoskeletal: No difficulty with gait.  Neurological: Alert and oriented.   Psychiatric: Mood and affect normal. Behavior is normal. Judgment and thought content normal.     BMET    Component Value Date/Time   NA 142 12/03/2015 1616   K 3.8 12/03/2015 1616   CL 105 12/03/2015 1616   CO2 28 12/03/2015 1616   GLUCOSE 82 12/03/2015 1616   BUN 20 12/03/2015 1616   CREATININE 0.86 12/03/2015 1616   CALCIUM 9.7 12/03/2015 1616   GFRNONAA >60 11/02/2009 1305   GFRAA  11/02/2009 1305    >60        The eGFR has been  calculated using the MDRD equation. This calculation has not been validated in all clinical situations. eGFR's persistently <60 mL/min signify possible Chronic Kidney Disease.    Lipid Panel     Component Value Date/Time   CHOL 187 12/03/2015 1616   TRIG 106.0 12/03/2015 1616   HDL 47.00 12/03/2015 1616   CHOLHDL 4 12/03/2015 1616   VLDL 21.2 12/03/2015 1616   LDLCALC 118 (H) 12/03/2015 1616    CBC    Component Value Date/Time   WBC 8.3 12/03/2015 1616   RBC 4.28 12/03/2015 1616  HGB 12.9 12/03/2015 1616   HCT 39.0 12/03/2015 1616   PLT 229.0 12/03/2015 1616   MCV 91.0 12/03/2015 1616   MCHC 33.2 12/03/2015 1616   RDW 14.0 12/03/2015 1616   LYMPHSABS 1.5 12/03/2015 1616   MONOABS 0.5 12/03/2015 1616   EOSABS 0.1 12/03/2015 1616   BASOSABS 0.1 12/03/2015 1616    Hgb A1C No results found for: HGBA1C   Assessment and Plan:

## 2018-04-23 NOTE — Assessment & Plan Note (Signed)
Improved Encouraged regular physical activity for muscle toning and weight loss

## 2018-04-23 NOTE — Assessment & Plan Note (Signed)
Continue Valtrex prn.  Refilled today.

## 2018-04-23 NOTE — Assessment & Plan Note (Signed)
Will check TSH and Free T4 today Will send in Levothyroxine if needed based on labs

## 2018-05-14 ENCOUNTER — Encounter: Payer: Self-pay | Admitting: Internal Medicine

## 2018-05-14 ENCOUNTER — Ambulatory Visit (INDEPENDENT_AMBULATORY_CARE_PROVIDER_SITE_OTHER): Payer: 59 | Admitting: Internal Medicine

## 2018-05-14 VITALS — BP 136/84 | HR 63 | Temp 98.0°F | Ht 60.5 in | Wt 264.0 lb

## 2018-05-14 DIAGNOSIS — Z1211 Encounter for screening for malignant neoplasm of colon: Secondary | ICD-10-CM

## 2018-05-14 DIAGNOSIS — E039 Hypothyroidism, unspecified: Secondary | ICD-10-CM

## 2018-05-14 DIAGNOSIS — Z780 Asymptomatic menopausal state: Secondary | ICD-10-CM | POA: Diagnosis not present

## 2018-05-14 DIAGNOSIS — Z0000 Encounter for general adult medical examination without abnormal findings: Secondary | ICD-10-CM

## 2018-05-14 DIAGNOSIS — I10 Essential (primary) hypertension: Secondary | ICD-10-CM

## 2018-05-14 DIAGNOSIS — E559 Vitamin D deficiency, unspecified: Secondary | ICD-10-CM | POA: Diagnosis not present

## 2018-05-14 DIAGNOSIS — I8393 Asymptomatic varicose veins of bilateral lower extremities: Secondary | ICD-10-CM | POA: Diagnosis not present

## 2018-05-14 LAB — COMPREHENSIVE METABOLIC PANEL
ALT: 11 U/L (ref 0–35)
AST: 11 U/L (ref 0–37)
Albumin: 4 g/dL (ref 3.5–5.2)
Alkaline Phosphatase: 91 U/L (ref 39–117)
BILIRUBIN TOTAL: 0.5 mg/dL (ref 0.2–1.2)
BUN: 13 mg/dL (ref 6–23)
CALCIUM: 9.1 mg/dL (ref 8.4–10.5)
CO2: 30 meq/L (ref 19–32)
Chloride: 104 mEq/L (ref 96–112)
Creatinine, Ser: 0.69 mg/dL (ref 0.40–1.20)
GFR: 91.34 mL/min (ref >60.00)
Glucose, Bld: 91 mg/dL (ref 70–99)
Potassium: 3.9 mEq/L (ref 3.5–5.1)
Sodium: 141 mEq/L (ref 135–145)
Total Protein: 6.3 g/dL (ref 6.0–8.3)

## 2018-05-14 LAB — LIPID PANEL
CHOLESTEROL: 164 mg/dL (ref 0–200)
HDL: 41.3 mg/dL (ref >39.00)
LDL Cholesterol: 103 mg/dL — ABNORMAL HIGH (ref 0–99)
NonHDL: 122.72
Total CHOL/HDL Ratio: 4
Triglycerides: 97 mg/dL (ref 0.0–149.0)
VLDL: 19.4 mg/dL (ref 0.0–40.0)

## 2018-05-14 LAB — TSH: TSH: 4.18 u[IU]/mL (ref 0.35–4.50)

## 2018-05-14 LAB — CBC
HCT: 39.8 % (ref 36.0–46.0)
Hemoglobin: 13.2 g/dL (ref 12.0–15.0)
MCHC: 33.3 g/dL (ref 30.0–36.0)
MCV: 91.6 fl (ref 78.0–100.0)
PLATELETS: 216 10*3/uL (ref 150.0–400.0)
RBC: 4.34 Mil/uL (ref 3.87–5.11)
RDW: 13.6 % (ref 11.5–15.5)
WBC: 5.7 10*3/uL (ref 4.0–10.5)

## 2018-05-14 LAB — T4, FREE: FREE T4: 0.75 ng/dL (ref 0.60–1.60)

## 2018-05-14 LAB — HEMOGLOBIN A1C: HEMOGLOBIN A1C: 5.6 % (ref 4.6–6.5)

## 2018-05-14 LAB — VITAMIN D 25 HYDROXY (VIT D DEFICIENCY, FRACTURES): VITD: 12.08 ng/mL — AB (ref 30.00–100.00)

## 2018-05-14 NOTE — Assessment & Plan Note (Signed)
TSH and Free T4 today Having myalgias since starting Levothyroxine, will switch to name brand Will adjust dose if needed based on labs.

## 2018-05-14 NOTE — Patient Instructions (Signed)
Health Maintenance for Postmenopausal Women Menopause is a normal process in which your reproductive ability comes to an end. This process happens gradually over a span of months to years, usually between the ages of 62 and 89. Menopause is complete when you have missed 12 consecutive menstrual periods. It is important to talk with your health care provider about some of the most common conditions that affect postmenopausal women, such as heart disease, cancer, and bone loss (osteoporosis). Adopting a healthy lifestyle and getting preventive care can help to promote your health and wellness. Those actions can also lower your chances of developing some of these common conditions. What should I know about menopause? During menopause, you may experience a number of symptoms, such as:  Moderate-to-severe hot flashes.  Night sweats.  Decrease in sex drive.  Mood swings.  Headaches.  Tiredness.  Irritability.  Memory problems.  Insomnia. Choosing to treat or not to treat menopausal changes is an individual decision that you make with your health care provider. What should I know about hormone replacement therapy and supplements? Hormone therapy products are effective for treating symptoms that are associated with menopause, such as hot flashes and night sweats. Hormone replacement carries certain risks, especially as you become older. If you are thinking about using estrogen or estrogen with progestin treatments, discuss the benefits and risks with your health care provider. What should I know about heart disease and stroke? Heart disease, heart attack, and stroke become more likely as you age. This may be due, in part, to the hormonal changes that your body experiences during menopause. These can affect how your body processes dietary fats, triglycerides, and cholesterol. Heart attack and stroke are both medical emergencies. There are many things that you can do to help prevent heart disease  and stroke:  Have your blood pressure checked at least every 1-2 years. High blood pressure causes heart disease and increases the risk of stroke.  If you are 79-72 years old, ask your health care provider if you should take aspirin to prevent a heart attack or a stroke.  Do not use any tobacco products, including cigarettes, chewing tobacco, or electronic cigarettes. If you need help quitting, ask your health care provider.  It is important to eat a healthy diet and maintain a healthy weight. ? Be sure to include plenty of vegetables, fruits, low-fat dairy products, and lean protein. ? Avoid eating foods that are high in solid fats, added sugars, or salt (sodium).  Get regular exercise. This is one of the most important things that you can do for your health. ? Try to exercise for at least 150 minutes each week. The type of exercise that you do should increase your heart rate and make you sweat. This is known as moderate-intensity exercise. ? Try to do strengthening exercises at least twice each week. Do these in addition to the moderate-intensity exercise.  Know your numbers.Ask your health care provider to check your cholesterol and your blood glucose. Continue to have your blood tested as directed by your health care provider.  What should I know about cancer screening? There are several types of cancer. Take the following steps to reduce your risk and to catch any cancer development as early as possible. Breast Cancer  Practice breast self-awareness. ? This means understanding how your breasts normally appear and feel. ? It also means doing regular breast self-exams. Let your health care provider know about any changes, no matter how small.  If you are 40 or  older, have a clinician do a breast exam (clinical breast exam or CBE) every year. Depending on your age, family history, and medical history, it may be recommended that you also have a yearly breast X-ray (mammogram).  If you  have a family history of breast cancer, talk with your health care provider about genetic screening.  If you are at high risk for breast cancer, talk with your health care provider about having an MRI and a mammogram every year.  Breast cancer (BRCA) gene test is recommended for women who have family members with BRCA-related cancers. Results of the assessment will determine the need for genetic counseling and BRCA1 and for BRCA2 testing. BRCA-related cancers include these types: ? Breast. This occurs in males or females. ? Ovarian. ? Tubal. This may also be called fallopian tube cancer. ? Cancer of the abdominal or pelvic lining (peritoneal cancer). ? Prostate. ? Pancreatic. Cervical, Uterine, and Ovarian Cancer Your health care provider may recommend that you be screened regularly for cancer of the pelvic organs. These include your ovaries, uterus, and vagina. This screening involves a pelvic exam, which includes checking for microscopic changes to the surface of your cervix (Pap test).  For women ages 21-65, health care providers may recommend a pelvic exam and a Pap test every three years. For women ages 39-65, they may recommend the Pap test and pelvic exam, combined with testing for human papilloma virus (HPV), every five years. Some types of HPV increase your risk of cervical cancer. Testing for HPV may also be done on women of any age who have unclear Pap test results.  Other health care providers may not recommend any screening for nonpregnant women who are considered low risk for pelvic cancer and have no symptoms. Ask your health care provider if a screening pelvic exam is right for you.  If you have had past treatment for cervical cancer or a condition that could lead to cancer, you need Pap tests and screening for cancer for at least 20 years after your treatment. If Pap tests have been discontinued for you, your risk factors (such as having a new sexual partner) need to be reassessed  to determine if you should start having screenings again. Some women have medical problems that increase the chance of getting cervical cancer. In these cases, your health care provider may recommend that you have screening and Pap tests more often.  If you have a family history of uterine cancer or ovarian cancer, talk with your health care provider about genetic screening.  If you have vaginal bleeding after reaching menopause, tell your health care provider.  There are currently no reliable tests available to screen for ovarian cancer. Lung Cancer Lung cancer screening is recommended for adults 57-50 years old who are at high risk for lung cancer because of a history of smoking. A yearly low-dose CT scan of the lungs is recommended if you:  Currently smoke.  Have a history of at least 30 pack-years of smoking and you currently smoke or have quit within the past 15 years. A pack-year is smoking an average of one pack of cigarettes per day for one year. Yearly screening should:  Continue until it has been 15 years since you quit.  Stop if you develop a health problem that would prevent you from having lung cancer treatment. Colorectal Cancer  This type of cancer can be detected and can often be prevented.  Routine colorectal cancer screening usually begins at age 12 and continues through  age 75.  If you have risk factors for colon cancer, your health care provider may recommend that you be screened at an earlier age.  If you have a family history of colorectal cancer, talk with your health care provider about genetic screening.  Your health care provider may also recommend using home test kits to check for hidden blood in your stool.  A small camera at the end of a tube can be used to examine your colon directly (sigmoidoscopy or colonoscopy). This is done to check for the earliest forms of colorectal cancer.  Direct examination of the colon should be repeated every 5-10 years until  age 75. However, if early forms of precancerous polyps or small growths are found or if you have a family history or genetic risk for colorectal cancer, you may need to be screened more often. Skin Cancer  Check your skin from head to toe regularly.  Monitor any moles. Be sure to tell your health care provider: ? About any new moles or changes in moles, especially if there is a change in a mole's shape or color. ? If you have a mole that is larger than the size of a pencil eraser.  If any of your family members has a history of skin cancer, especially at a young age, talk with your health care provider about genetic screening.  Always use sunscreen. Apply sunscreen liberally and repeatedly throughout the day.  Whenever you are outside, protect yourself by wearing long sleeves, pants, a wide-brimmed hat, and sunglasses. What should I know about osteoporosis? Osteoporosis is a condition in which bone destruction happens more quickly than new bone creation. After menopause, you may be at an increased risk for osteoporosis. To help prevent osteoporosis or the bone fractures that can happen because of osteoporosis, the following is recommended:  If you are 19-50 years old, get at least 1,000 mg of calcium and at least 600 mg of vitamin D per day.  If you are older than age 50 but younger than age 70, get at least 1,200 mg of calcium and at least 600 mg of vitamin D per day.  If you are older than age 70, get at least 1,200 mg of calcium and at least 800 mg of vitamin D per day. Smoking and excessive alcohol intake increase the risk of osteoporosis. Eat foods that are rich in calcium and vitamin D, and do weight-bearing exercises several times each week as directed by your health care provider. What should I know about how menopause affects my mental health? Depression may occur at any age, but it is more common as you become older. Common symptoms of depression include:  Low or sad  mood.  Changes in sleep patterns.  Changes in appetite or eating patterns.  Feeling an overall lack of motivation or enjoyment of activities that you previously enjoyed.  Frequent crying spells. Talk with your health care provider if you think that you are experiencing depression. What should I know about immunizations? It is important that you get and maintain your immunizations. These include:  Tetanus, diphtheria, and pertussis (Tdap) booster vaccine.  Influenza every year before the flu season begins.  Pneumonia vaccine.  Shingles vaccine. Your health care provider may also recommend other immunizations. This information is not intended to replace advice given to you by your health care provider. Make sure you discuss any questions you have with your health care provider. Document Released: 07/01/2005 Document Revised: 11/27/2015 Document Reviewed: 02/10/2015 Elsevier Interactive Patient Education    2019 [place].

## 2018-05-14 NOTE — Progress Notes (Signed)
Subjective:    Patient ID: Angelica Hess, female    DOB: 1955/09/02, 62 y.o.   MRN: [id number]  HPI  Pt presents to the clinic today for her annual exam. She is also due for followup.  HTN: Started on Olmesartan at her last visit. She has been taking the medication as prescribed. Her BP today is 136/84. She has not taken her blood pressure medication this morning. She is under a lot of stress right now.  Hypothyroidism: She has noticed myalgias since starting Levothyroxine. She is due for repeat labs today.  Flu: 01/2018 Tetanus: 10/2013 Zostovax: 11/2015 Shingrix: never Mammogram: 11/2015 Pap Smear: 11/2015 Bone Density: more than 5 years Colon Screening: ? 2007, [facility] Screening: as needed Dentist: as needed  Diet: She does eat meat. She consumes fruits and veggies daily. She does eat fried foods. She drinks mostly coffee, water, tea Exercise: None  Review of Systems      Past Medical History:  Diagnosis Date  . Fibromyalgia   . Genital herpes   . Hypertension   . Hypothyroidism   . Sleep apnea     Current Outpatient Medications  Medication Sig Dispense Refill  . fexofenadine (ALLEGRA) 180 MG tablet Take 180 mg by mouth daily.    Marland Kitchen levothyroxine (SYNTHROID, LEVOTHROID) 50 MCG tablet Take 1 tablet (50 mcg total) by mouth daily. 30 tablet 0  . olmesartan (BENICAR) 5 MG tablet Take 1 tablet (5 mg total) by mouth daily. 30 tablet 0  . valACYclovir (VALTREX) 500 MG tablet TAKE ONE CAPLET BY MOUTH TWICE DAILY PRN FOR OUTBREAKS 30 tablet 5   No current facility-administered medications for this visit.     Allergies  Allergen Reactions  . Penicillins Itching  . Sulfa Antibiotics     Allergy as a child    Family History  Problem Relation Age of Onset  . Alzheimer's disease Mother   . Breast cancer Mother 10    Social History   Socioeconomic History  . Marital status: Married    Spouse name: Not on file  . Number of children: Not on file  . Years of  education: Not on file  . Highest education level: Not on file  Occupational History  . Not on file  Social Needs  . Financial resource strain: Not on file  . Food insecurity:    Worry: Not on file    Inability: Not on file  . Transportation needs:    Medical: Not on file    Non-medical: Not on file  Tobacco Use  . Smoking status: Former Research scientist (life sciences)  . Smokeless tobacco: Never Used  Substance and Sexual Activity  . Alcohol use: No  . Drug use: Not on file  . Sexual activity: Not on file  Lifestyle  . Physical activity:    Days per week: Not on file    Minutes per session: Not on file  . Stress: Not on file  Relationships  . Social connections:    Talks on phone: Not on file    Gets together: Not on file    Attends religious service: Not on file    Active member of club or organization: Not on file    Attends meetings of clubs or organizations: Not on file    Relationship status: Not on file  . Intimate partner violence:    Fear of current or ex partner: Not on file    Emotionally abused: Not on file    Physically abused: Not on  file    Forced sexual activity: Not on file  Other Topics Concern  . Not on file  Social History Narrative   Art therapist.   Married- 7 years.   Has one daughter in 34s, 4 grand children.           Constitutional: Denies fever, malaise, fatigue, headache or abrupt weight changes.  HEENT: Pt reports visual changes. Denies eye pain, eye redness, ear pain, ringing in the ears, wax buildup, runny nose, nasal congestion, bloody nose, or sore throat. Respiratory: Denies difficulty breathing, shortness of breath, cough or sputum production.   Cardiovascular: Denies chest pain, chest tightness, palpitations or swelling in the hands or feet.  Gastrointestinal: Pt reports intermittent constipation. Denies abdominal pain, bloating, diarrhea or blood in the stool.  GU: Pt reports mixed incontinence. Denies urgency, frequency, pain with urination,  burning sensation, blood in urine, odor or discharge. Musculoskeletal: Pt reports muscle pain. Denies decrease in range of motion, difficulty with gait, or joint pain and swelling.  Skin: Denies redness, rashes, lesions or ulcercations.  Neurological: Denies dizziness, difficulty with memory, difficulty with speech or problems with balance and coordination.  Psych: Denies anxiety, depression, SI/HI.  No other specific complaints in a complete review of systems (except as listed in HPI above).  Objective:   Physical Exam   BP 136/84   Pulse 63   Temp 98 F (36.7 C) (Oral)   Ht 5' 0.5" (1.537 m)   Wt 264 lb (119.7 kg)   SpO2 98%   BMI 50.71 kg/m  Wt Readings from Last 3 Encounters:  05/14/18 264 lb (119.7 kg)  04/23/18 265 lb (120.2 kg)  12/07/15 257 lb 12 oz (116.9 kg)    General: Appears herstated age, obese, in NAD. Skin: Warm, dry and intact. No rashes, lesions or ulcerations noted. HEENT: Head: normal shape and size; Eyes: sclera white, no icterus, conjunctiva pink, PERRLA and EOMs intact; Ears: Tm's gray and intact, normal light reflex; Throat/Mouth: Teeth present, mucosa pink and moist, no exudate, lesions or ulcerations noted.  Neck:  Neck supple, trachea midline. No masses, lumps or thyromegaly present.  Cardiovascular: Normal rate and rhythm. S1,S2 noted.  No murmur, rubs or gallops noted. No JVD or BLE edema. No carotid bruits noted. Varicose veins of BLE. Pulmonary/Chest: Normal effort and positive vesicular breath sounds. No respiratory distress. No wheezes, rales or ronchi noted.  Abdomen: Soft and nontender. Normal bowel sounds. No distention or masses noted. Liver, spleen and kidneys non palpable. Musculoskeletal: Strength 5/5 BUE/BLE. No difficulty with gait.  Neurological: Alert and oriented. Cranial nerves II-XII grossly intact. Coordination normal.  Psychiatric: Mood and affect normal. Behavior is normal. Judgment and thought content normal.   BMET      Component Value Date/Time   NA 142 12/03/2015 1616   K 3.8 12/03/2015 1616   CL 105 12/03/2015 1616   CO2 28 12/03/2015 1616   GLUCOSE 82 12/03/2015 1616   BUN 20 12/03/2015 1616   CREATININE 0.86 12/03/2015 1616   CALCIUM 9.7 12/03/2015 1616   GFRNONAA >60 11/02/2009 1305   GFRAA  11/02/2009 1305    >60        The eGFR has been calculated using the MDRD equation. This calculation has not been validated in all clinical situations. eGFR's persistently <60 mL/min signify possible Chronic Kidney Disease.    Lipid Panel     Component Value Date/Time   CHOL 187 12/03/2015 1616   TRIG 106.0 12/03/2015 1616   HDL 47.00  12/03/2015 1616   CHOLHDL 4 12/03/2015 1616   VLDL 21.2 12/03/2015 1616   LDLCALC 118 (H) 12/03/2015 1616    CBC    Component Value Date/Time   WBC 8.3 12/03/2015 1616   RBC 4.28 12/03/2015 1616   HGB 12.9 12/03/2015 1616   HCT 39.0 12/03/2015 1616   PLT 229.0 12/03/2015 1616   MCV 91.0 12/03/2015 1616   MCHC 33.2 12/03/2015 1616   RDW 14.0 12/03/2015 1616   LYMPHSABS 1.5 12/03/2015 1616   MONOABS 0.5 12/03/2015 1616   EOSABS 0.1 12/03/2015 1616   BASOSABS 0.1 12/03/2015 1616    Hgb A1C No results found for: HGBA1C         Assessment & Plan:   Preventative Health Maintenance:  Flu shot UTD Tetanus UTD Pap Smear UTD She will call to schedule mammogram Bone density ordered Referral to GI placed for screening colonoscopy Encouraged to consume a balanced diet and exercise regimen Advised her to see an eye doctor and dentist annually Will check CBC, CMET, Lipid, TSH, Free T4, Vit D and A1C today  RTC in 2 weeks follow up HTN Webb Silversmith, NP

## 2018-05-14 NOTE — Assessment & Plan Note (Signed)
No improvement with Olmesartan ? Compliance She declines to increase dose at this time She wants to give it another 2 weeks and see if any improvement Reinforced DASH diet and exercise for weight loss

## 2018-05-15 MED ORDER — FEXOFENADINE HCL 180 MG PO TABS: 180.0000 mg | ORAL_TABLET | Freq: Every day | ORAL | 2 refills | Status: AC

## 2018-05-15 MED ORDER — LEVOTHYROXINE SODIUM 50 MCG PO TABS: 50.0000 ug | ORAL_TABLET | Freq: Every day | ORAL | 2 refills | Status: AC

## 2018-05-15 MED ORDER — OLMESARTAN MEDOXOMIL 5 MG PO TABS: 5.0000 mg | ORAL_TABLET | Freq: Every day | ORAL | 2 refills | Status: DC

## 2018-05-15 NOTE — Addendum Note (Signed)
Addended by: Lurlean Nanny on: 05/15/2018 12:40 PM   Modules accepted: Orders

## 2018-05-18 ENCOUNTER — Other Ambulatory Visit: Payer: Self-pay

## 2018-05-18 MED ORDER — VITAMIN D (ERGOCALCIFEROL) 1.25 MG (50000 UNIT) PO CAPS: 50000.0000 [IU] | ORAL_CAPSULE | ORAL | 0 refills | Status: DC

## 2018-05-22 ENCOUNTER — Other Ambulatory Visit: Payer: Self-pay | Admitting: Internal Medicine

## 2018-05-22 DIAGNOSIS — Z1231 Encounter for screening mammogram for malignant neoplasm of breast: Secondary | ICD-10-CM

## 2018-06-05 ENCOUNTER — Other Ambulatory Visit: Payer: Self-pay

## 2018-06-05 DIAGNOSIS — I83893 Varicose veins of bilateral lower extremities with other complications: Secondary | ICD-10-CM

## 2018-06-29 ENCOUNTER — Ambulatory Visit: Payer: 59 | Admitting: Gastroenterology

## 2018-07-12 ENCOUNTER — Other Ambulatory Visit: Payer: 59

## 2018-07-12 ENCOUNTER — Ambulatory Visit: Payer: 59

## 2018-07-23 ENCOUNTER — Ambulatory Visit (HOSPITAL_COMMUNITY)
Admission: RE | Admit: 2018-07-23 | Discharge: 2018-07-23 | Disposition: A | Discharge status: Home / Self Care | Payer: 59 | Source: Ambulatory Visit | Attending: Surgery | Admitting: Surgery

## 2018-07-23 ENCOUNTER — Encounter: Payer: Self-pay | Admitting: Surgery

## 2018-07-23 ENCOUNTER — Other Ambulatory Visit: Payer: Self-pay

## 2018-07-23 ENCOUNTER — Ambulatory Visit (INDEPENDENT_AMBULATORY_CARE_PROVIDER_SITE_OTHER): Payer: 59 | Admitting: Surgery

## 2018-07-23 VITALS — BP 158/79 | HR 69 | Temp 98.0°F | Resp 20 | Ht 60.0 in | Wt 266.6 lb

## 2018-07-23 DIAGNOSIS — I83893 Varicose veins of bilateral lower extremities with other complications: Secondary | ICD-10-CM

## 2018-07-23 NOTE — Progress Notes (Signed)
Vascular and Vein Specialist of [facility]  Patient name: Angelica Hess MRN: [id number] DOB: 01/01/1956 Sex: female   REQUESTING PROVIDER:    Webb Silversmith   REASON FOR CONSULT:    Venous insufficiency  HISTORY OF PRESENT ILLNESS:   Angelica Hess is a 63 y.o. female, who is referred for evaluation of leg swelling and varicose veins.  Patient has a history of morbid obesity.  She is medically managed for hypertension.  She is a former smoker.  She is trying to become more active and lose weight but is concerned about the prominence of some veins on her legs.  She does not wear compression stockings.  She does not have a history of DVT.  PAST MEDICAL HISTORY    Past Medical History:  Diagnosis Date  . Fibromyalgia   . Genital herpes   . Hypertension   . Hypothyroidism   . Sleep apnea      FAMILY HISTORY   Family History  Problem Relation Age of Onset  . Alzheimer's disease Mother   . Breast cancer Mother 30    SOCIAL HISTORY:   Social History   Socioeconomic History  . Marital status: Married    Spouse name: Not on file  . Number of children: Not on file  . Years of education: Not on file  . Highest education level: Not on file  Occupational History  . Not on file  Social Needs  . Financial resource strain: Not on file  . Food insecurity:    Worry: Not on file    Inability: Not on file  . Transportation needs:    Medical: Not on file    Non-medical: Not on file  Tobacco Use  . Smoking status: Former Research scientist (life sciences)  . Smokeless tobacco: Never Used  Substance and Sexual Activity  . Alcohol use: No  . Drug use: Never  . Sexual activity: Not on file  Lifestyle  . Physical activity:    Days per week: Not on file    Minutes per session: Not on file  . Stress: Not on file  Relationships  . Social connections:    Talks on phone: Not on file    Gets together: Not on file    Attends religious service: Not on file    Active member of  club or organization: Not on file    Attends meetings of clubs or organizations: Not on file    Relationship status: Not on file  . Intimate partner violence:    Fear of current or ex partner: Not on file    Emotionally abused: Not on file    Physically abused: Not on file    Forced sexual activity: Not on file  Other Topics Concern  . Not on file  Social History Narrative   Art therapist.   Married- 7 years.   Has one daughter in 36s, 4 grand children.          ALLERGIES:    Allergies  Allergen Reactions  . Penicillins Itching  . Sulfa Antibiotics     Allergy as a child    CURRENT MEDICATIONS:    Current Outpatient Medications  Medication Sig Dispense Refill  . Cyanocobalamin (VITAMIN B 12 PO) Take by mouth.    . fexofenadine (ALLEGRA) 180 MG tablet Take 1 tablet (180 mg total) by mouth daily. 90 tablet 2  . ibuprofen (ADVIL,MOTRIN) 200 MG tablet Take 200 mg by mouth as needed.    Marland Kitchen levothyroxine (SYNTHROID, LEVOTHROID) 50 MCG tablet  Take 1 tablet (50 mcg total) by mouth daily. 90 tablet 2  . olmesartan (BENICAR) 5 MG tablet Take 1 tablet (5 mg total) by mouth daily. 90 tablet 2  . valACYclovir (VALTREX) 500 MG tablet TAKE ONE CAPLET BY MOUTH TWICE DAILY PRN FOR OUTBREAKS 30 tablet 5  . Vitamin D, Ergocalciferol, (DRISDOL) 1.25 MG (50000 UT) CAPS capsule Take 1 capsule (50,000 Units total) by mouth every 7 (seven) days. 12 capsule 0   No current facility-administered medications for this visit.     REVIEW OF SYSTEMS:   [X]  denotes positive finding, [ ]  denotes negative finding Cardiac  Comments:  Chest pain or chest pressure:    Shortness of breath upon exertion:    Short of breath when lying flat:    Irregular heart rhythm:        Vascular    Pain in calf, thigh, or hip brought on by ambulation: x   Pain in feet at night that wakes you up from your sleep:     Blood clot in your veins:    Leg swelling:  x       Pulmonary    Oxygen at home:      Productive cough:     Wheezing:         Neurologic    Sudden weakness in arms or legs:     Sudden numbness in arms or legs:     Sudden onset of difficulty speaking or slurred speech:    Temporary loss of vision in one eye:     Problems with dizziness:         Gastrointestinal    Blood in stool:      Vomited blood:         Genitourinary    Burning when urinating:     Blood in urine:        Psychiatric    Major depression:         Hematologic    Bleeding problems:    Problems with blood clotting too easily:        Skin    Rashes or ulcers:        Constitutional    Fever or chills:     PHYSICAL EXAM:   Vitals:   07/23/18 1246  BP: (!) 158/79  Pulse: 69  Resp: 20  Temp: 98 F (36.7 C)  SpO2: 99%  Weight: 266 lb 10.3 oz (120.9 kg)  Height: 5' (1.524 m)    GENERAL: The patient is a well-nourished female, in no acute distress. The vital signs are documented above. CARDIAC: There is a regular rate and rhythm.  VASCULAR: Bilateral pitting edema with mild varicosities around the knee going into the lower calf on the right PULMONARY: Nonlabored respirations MUSCULOSKELETAL: There are no major deformities or cyanosis. NEUROLOGIC: No focal weakness or paresthesias are detected. SKIN: There are no ulcers or rashes noted. PSYCHIATRIC: The patient has a normal affect.  STUDIES:   I have ordered and reviewed her u/s with the following resluts: Venous Reflux Times Normal value < 0.5 sec +---+----------+---------+    Right (ms)Left (ms) +---+----------+---------+ CFV777.00              +---+----------+---------+  Vein Diameters: +------------------------------+----------+---------+                               Right (cm)Left (cm) +------------------------------+----------+---------+ GSV at Saphenofemoral junction0.63      0.90      +------------------------------+----------+---------+  GSV at prox thigh                       0.42       +------------------------------+----------+---------+ GSV at mid thigh              0.32                +------------------------------+----------+---------+ GSV at distal thigh           0.26      0.22      +------------------------------+----------+---------+ GSV at knee                   0.24      0.23      +------------------------------+----------+---------+ SSV prox                      0.27      0.30      +------------------------------+----------+---------+    ASSESSMENT and PLAN   Leg swelling: Based on ultrasound, I do not think that she has significant venous insufficiency.  Her leg swelling is likely secondary to lymphedema.  I have recommended keeping her legs elevated when possible.  We also discussed the importance of wearing graduated compression stockings.  I do not think that her varicose veins need to be addressed at this time, however if she develops pain or a cosmetic concern these can easily be addressed.  She will follow-up with me on an as-needed basis.   Annamarie Major, MD [facility] and Vein Specialists of [facility] [phone] Pager [phone]

## 2018-08-20 ENCOUNTER — Other Ambulatory Visit: Payer: 59

## 2018-08-20 ENCOUNTER — Inpatient Hospital Stay: Admission: RE | Admit: 2018-08-20 | Payer: 59 | Source: Ambulatory Visit

## 2018-10-12 ENCOUNTER — Other Ambulatory Visit: Payer: 59

## 2018-10-12 ENCOUNTER — Ambulatory Visit: Payer: 59

## 2018-10-25 ENCOUNTER — Encounter: Payer: Self-pay | Admitting: Internal Medicine

## 2018-10-25 ENCOUNTER — Ambulatory Visit: Payer: 59 | Admitting: Internal Medicine

## 2018-10-25 ENCOUNTER — Other Ambulatory Visit: Payer: Self-pay

## 2018-10-25 VITALS — BP 138/86 | HR 99 | Temp 98.5°F | Wt 269.0 lb

## 2018-10-25 DIAGNOSIS — R350 Frequency of micturition: Secondary | ICD-10-CM

## 2018-10-25 DIAGNOSIS — R3915 Urgency of urination: Secondary | ICD-10-CM

## 2018-10-25 DIAGNOSIS — R310 Gross hematuria: Secondary | ICD-10-CM

## 2018-10-25 LAB — POC URINALSYSI DIPSTICK (AUTOMATED)
Bilirubin, UA: NEGATIVE
Glucose, UA: NEGATIVE
Ketones, UA: NEGATIVE
Nitrite, UA: NEGATIVE
Protein, UA: POSITIVE — AB
Spec Grav, UA: 1.015 (ref 1.010–1.025)
Urobilinogen, UA: 0.2 E.U./dL
pH, UA: 6 (ref 5.0–8.0)

## 2018-10-25 MED ORDER — NITROFURANTOIN MONOHYD MACRO 100 MG PO CAPS: 100.0000 mg | ORAL_CAPSULE | Freq: Two times a day (BID) | ORAL | 0 refills | Status: DC

## 2018-10-25 NOTE — Progress Notes (Signed)
HPI  Pt presents to the clinic today with c/o urinary urgency, frequency and blood in her urine. She reports this started this morning. She denies abdominal pain, low back pain, fever, chills or nausea. She has not taken anything OTC for this.   Review of Systems  Past Medical History:  Diagnosis Date  . Fibromyalgia   . Genital herpes   . Hypertension   . Hypothyroidism   . Sleep apnea     Family History  Problem Relation Age of Onset  . Alzheimer's disease Mother   . Breast cancer Mother 39    Social History   Socioeconomic History  . Marital status: Married    Spouse name: Not on file  . Number of children: Not on file  . Years of education: Not on file  . Highest education level: Not on file  Occupational History  . Not on file  Social Needs  . Financial resource strain: Not on file  . Food insecurity:    Worry: Not on file    Inability: Not on file  . Transportation needs:    Medical: Not on file    Non-medical: Not on file  Tobacco Use  . Smoking status: Former Research scientist (life sciences)  . Smokeless tobacco: Never Used  Substance and Sexual Activity  . Alcohol use: No  . Drug use: Never  . Sexual activity: Not on file  Lifestyle  . Physical activity:    Days per week: Not on file    Minutes per session: Not on file  . Stress: Not on file  Relationships  . Social connections:    Talks on phone: Not on file    Gets together: Not on file    Attends religious service: Not on file    Active member of club or organization: Not on file    Attends meetings of clubs or organizations: Not on file    Relationship status: Not on file  . Intimate partner violence:    Fear of current or ex partner: Not on file    Emotionally abused: Not on file    Physically abused: Not on file    Forced sexual activity: Not on file  Other Topics Concern  . Not on file  Social History Narrative   Art therapist.   Married- 7 years.   Has one daughter in 17s, 4 grand children.           Allergies  Allergen Reactions  . Penicillins Itching  . Sulfa Antibiotics     Allergy as a child     Constitutional: Denies fever, malaise, fatigue, headache or abrupt weight changes.   GU: Pt reports urgency, frequency and pain with urination. Denies burning sensation, blood in urine, odor or discharge. Skin: Denies redness, rashes, lesions or ulcercations.   No other specific complaints in a complete review of systems (except as listed in HPI above).    Objective:   Physical Exam  BP 138/86   Pulse 99   Temp 98.5 F (36.9 C) (Oral)   Wt 269 lb (122 kg)   SpO2 98%   BMI 52.54 kg/m  Wt Readings from Last 3 Encounters:  10/25/18 269 lb (122 kg)  07/23/18 266 lb 10.3 oz (120.9 kg)  05/14/18 264 lb (119.7 kg)    General: Appears her stated age, obese in NAD. Cardiovascular: Normal rate and rhythm. S1,S2 noted.   Pulmonary/Chest: Normal effort and positive vesicular breath sounds. No respiratory distress. No wheezes, rales or ronchi noted.  Abdomen:  Soft. Normal bowel sounds. No distention or masses noted.  Tender to palpation over the bladder area. No CVA tenderness.        Assessment & Plan:   Urgency, Frequency, Blood in Urine secondary to UTI  Urinalysis: 2+ leuks, 3+ blood Will send urine culture eRx sent if for Macrobid 100 mg BID x 5 days OK to take AZO OTC Drink plenty of fluids  RTC as needed or if symptoms persist. Webb Silversmith, NP

## 2018-10-25 NOTE — Patient Instructions (Signed)
Urinary Tract Infection, Adult A urinary tract infection (UTI) is an infection of any part of the urinary tract. The urinary tract includes:  The kidneys.  The ureters.  The bladder.  The urethra. These organs make, store, and get rid of pee (urine) in the body. What are the causes? This is caused by germs (bacteria) in your genital area. These germs grow and cause swelling (inflammation) of your urinary tract. What increases the risk? You are more likely to develop this condition if:  You have a small, thin tube (catheter) to drain pee.  You cannot control when you pee or poop (incontinence).  You are female, and: ? You use these methods to prevent pregnancy: ? A medicine that kills sperm (spermicide). ? A device that blocks sperm (diaphragm). ? You have low levels of a female hormone (estrogen). ? You are pregnant.  You have genes that add to your risk.  You are sexually active.  You take antibiotic medicines.  You have trouble peeing because of: ? A prostate that is bigger than normal, if you are female. ? A blockage in the part of your body that drains pee from the bladder (urethra). ? A kidney stone. ? A nerve condition that affects your bladder (neurogenic bladder). ? Not getting enough to drink. ? Not peeing often enough.  You have other conditions, such as: ? Diabetes. ? A weak disease-fighting system (immune system). ? Sickle cell disease. ? Gout. ? Injury of the spine. What are the signs or symptoms? Symptoms of this condition include:  Needing to pee right away (urgently).  Peeing often.  Peeing small amounts often.  Pain or burning when peeing.  Blood in the pee.  Pee that smells bad or not like normal.  Trouble peeing.  Pee that is cloudy.  Fluid coming from the vagina, if you are female.  Pain in the belly or lower back. Other symptoms include:  Throwing up (vomiting).  No urge to eat.  Feeling mixed up (confused).  Being tired  and grouchy (irritable).  A fever.  Watery poop (diarrhea). How is this treated? This condition may be treated with:  Antibiotic medicine.  Other medicines.  Drinking enough water. Follow these instructions at home:  Medicines  Take over-the-counter and prescription medicines only as told by your doctor.  If you were prescribed an antibiotic medicine, take it as told by your doctor. Do not stop taking it even if you start to feel better. General instructions  Make sure you: ? Pee until your bladder is empty. ? Do not hold pee for a long time. ? Empty your bladder after sex. ? Wipe from front to back after pooping if you are a female. Use each tissue one time when you wipe.  Drink enough fluid to keep your pee pale yellow.  Keep all follow-up visits as told by your doctor. This is important. Contact a doctor if:  You do not get better after 1-2 days.  Your symptoms go away and then come back. Get help right away if:  You have very bad back pain.  You have very bad pain in your lower belly.  You have a fever.  You are sick to your stomach (nauseous).  You are throwing up. Summary  A urinary tract infection (UTI) is an infection of any part of the urinary tract.  This condition is caused by germs in your genital area.  There are many risk factors for a UTI. These include having a small, thin   tube to drain pee and not being able to control when you pee or poop.  Treatment includes antibiotic medicines for germs.  Drink enough fluid to keep your pee pale yellow. This information is not intended to replace advice given to you by your health care provider. Make sure you discuss any questions you have with your health care provider. Document Released: 10/26/2007 Document Revised: 11/16/2017 Document Reviewed: 11/16/2017 Elsevier Interactive Patient Education  2019 [employer] Inc.  

## 2018-10-26 LAB — URINE CULTURE
MICRO NUMBER:: 537598
Result:: NO GROWTH
SPECIMEN QUALITY:: ADEQUATE

## 2018-10-29 ENCOUNTER — Telehealth: Payer: Self-pay | Admitting: *Deleted

## 2018-10-29 NOTE — Telephone Encounter (Signed)
Although you were symptomatic and had blood in your urine, the culture did not grow any bacteria which means it was not caused by an infection.  It could have been caused by cystitis, which can present similarly but not be caused by bacteria.  At this point, the recommendation is to stop taking the antibiotic, increase your fluids, and return if symptoms persist.

## 2018-10-29 NOTE — Telephone Encounter (Signed)
Patient called stating that she was seen on 10/25/18 for urinary symptoms. Patient stated that she does not understand her urine test results. Patient stated the results shows blood and some abnormalities and wants to know what these results mean? Patient stated that she is not seeing the blood in her urine like she was. Patient stated that she is still taking the antibiotic and has 3 pills left.

## 2018-10-30 NOTE — Telephone Encounter (Signed)
Pt is aware as instructed and expressed understanding 

## 2018-12-14 ENCOUNTER — Ambulatory Visit
Admission: RE | Admit: 2018-12-14 | Discharge: 2018-12-14 | Disposition: A | Discharge status: Home / Self Care | Payer: 59 | Source: Ambulatory Visit | Attending: Internal Medicine | Admitting: Internal Medicine

## 2018-12-14 ENCOUNTER — Other Ambulatory Visit: Payer: Self-pay

## 2018-12-14 DIAGNOSIS — Z1231 Encounter for screening mammogram for malignant neoplasm of breast: Secondary | ICD-10-CM

## 2018-12-14 DIAGNOSIS — Z780 Asymptomatic menopausal state: Secondary | ICD-10-CM

## 2018-12-24 ENCOUNTER — Telehealth: Payer: Self-pay | Admitting: Internal Medicine

## 2018-12-24 DIAGNOSIS — E039 Hypothyroidism, unspecified: Secondary | ICD-10-CM

## 2018-12-24 NOTE — Telephone Encounter (Signed)
Best number [phone]  Pt called wanting to get a referral to [place] [facility] endocrinology [phone]

## 2018-12-25 NOTE — Telephone Encounter (Signed)
Pt wants referral for Hypothyroidism

## 2018-12-26 NOTE — Telephone Encounter (Signed)
Referral placed.

## 2018-12-26 NOTE — Addendum Note (Signed)
Addended by: Jearld Fenton on: 12/26/2018 01:40 PM   Modules accepted: Orders

## 2018-12-28 ENCOUNTER — Telehealth: Payer: Self-pay | Admitting: Internal Medicine

## 2018-12-28 NOTE — Telephone Encounter (Signed)
LMOM.  Referral authorized to [facility].

## 2019-03-18 ENCOUNTER — Ambulatory Visit: Payer: Self-pay | Admitting: Urology

## 2019-03-18 ENCOUNTER — Encounter: Payer: Self-pay | Admitting: Urology

## 2019-03-18 ENCOUNTER — Other Ambulatory Visit: Payer: Self-pay

## 2019-03-18 VITALS — BP 142/74 | HR 84 | Ht 60.0 in | Wt 268.0 lb

## 2019-03-18 DIAGNOSIS — R32 Unspecified urinary incontinence: Secondary | ICD-10-CM

## 2019-03-18 LAB — URINALYSIS, COMPLETE
Bilirubin, UA: NEGATIVE
Glucose, UA: NEGATIVE
Ketones, UA: NEGATIVE
Leukocytes,UA: NEGATIVE
Nitrite, UA: NEGATIVE
Protein,UA: NEGATIVE
RBC, UA: NEGATIVE
Specific Gravity, UA: 1.015 (ref 1.005–1.030)
Urobilinogen, Ur: 0.2 mg/dL (ref 0.2–1.0)
pH, UA: 7 (ref 5.0–7.5)

## 2019-03-18 MED ORDER — OXYBUTYNIN CHLORIDE ER 10 MG PO TB24: 10.0000 mg | ORAL_TABLET | Freq: Every day | ORAL | 11 refills | Status: DC

## 2019-03-18 NOTE — Progress Notes (Signed)
03/18/2019 11:19 AM   Angelica Hess November 09, 1955 [id number]  Referring provider: Jearld Fenton, NP [address] [place],  [place] [address]  Chief Complaint  Patient presents with  . Urinary Incontinence    HPI: I was consulted to assess the patient's 67-month history of urinary incontinence.  She leaks with coughing sneezing laughing but not bending lifting.  She has urge incontinence with little warning.  Both are significant.  I do not think she has bedwetting.  She wears 6-10 pads a day moderately wet or soaked  She voids every 1 or 2 hours.  In dental office sometimes she has to void every 20 minutes due to urgency.  She cannot hold it for 2 hours.  She described an episode of blood in the urine treated with an antibiotic last July but then the culture apparently was negative  She denies history of kidney stones previous GU surgery and bladder infections.  No neurologic issues.  Has not had a hysterectomy.  Bowel movements normal.  No medical treatment.  Modifying factors: There are no other modifying factors  Associated signs and symptoms: There are no other associated signs and symptoms Aggravating and relieving factors: There are no other aggravating or relieving factors Severity: Moderate Duration: Persistent   PMH: Past Medical History:  Diagnosis Date  . Fibromyalgia   . Genital herpes   . Hypertension   . Hypothyroidism   . Sleep apnea     Surgical History: Past Surgical History:  Procedure Laterality Date  . CESAREAN SECTION    . TEMPOROMANDIBULAR JOINT SURGERY      Home Medications:  Allergies as of 03/18/2019      Reactions   Penicillins Itching   Sulfa Antibiotics    Allergy as a child      Medication List       Accurate as of March 18, 2019 11:19 AM. If you have any questions, ask your nurse or doctor.        fexofenadine 180 MG tablet Commonly known as: ALLEGRA Take 1 tablet (180 mg total) by mouth daily.   ibuprofen 200 MG  tablet Commonly known as: ADVIL Take 200 mg by mouth as needed.   levothyroxine 50 MCG tablet Commonly known as: SYNTHROID Take 1 tablet (50 mcg total) by mouth daily.   nitrofurantoin (macrocrystal-monohydrate) 100 MG capsule Commonly known as: MACROBID Take 1 capsule (100 mg total) by mouth 2 (two) times daily.   olmesartan 5 MG tablet Commonly known as: BENICAR Take 1 tablet (5 mg total) by mouth daily.   valACYclovir 500 MG tablet Commonly known as: VALTREX TAKE ONE CAPLET BY MOUTH TWICE DAILY PRN FOR OUTBREAKS   VITAMIN B 12 PO Take by mouth.       Allergies:  Allergies  Allergen Reactions  . Penicillins Itching  . Sulfa Antibiotics     Allergy as a child    Family History: Family History  Problem Relation Age of Onset  . Alzheimer's disease Mother   . Breast cancer Mother 65    Social History:  reports that she has quit smoking. She has never used smokeless tobacco. She reports that she does not drink alcohol or use drugs.  ROS:                                        Physical Exam: There were no vitals taken for this  visit.  Constitutional:  Alert and oriented, No acute distress. HEENT: [place] she has urge incontinence with very little warning.  She leaks with coughing sneezing laughing but not bending lifting.  Both are significant.  She might have dampness at night but probably does not, moist mucus membranes.  Trachea midline, no masses. Cardiovascular: No clubbing, cyanosis, or edema. Respiratory: Normal respiratory effort, no increased work of breathing. GI: Abdomen is soft, nontender, nondistended, no abdominal masses GU: Exam a bit limited due to obesity.  Well supported bladder neck and negative cough test Skin: No rashes, bruises or suspicious lesions. Lymph: No cervical or inguinal adenopathy. Neurologic: Grossly intact, no focal deficits, moving all 4 extremities. Psychiatric: Normal mood and affect.  Laboratory Data:  Lab Results  Component Value Date   WBC 5.7 05/14/2018   HGB 13.2 05/14/2018   HCT 39.8 05/14/2018   MCV 91.6 05/14/2018   PLT 216.0 05/14/2018    Lab Results  Component Value Date   CREATININE 0.69 05/14/2018    No results found for: PSA  No results found for: TESTOSTERONE  Lab Results  Component Value Date   HGBA1C 5.6 05/14/2018    Urinalysis    Component Value Date/Time   COLORURINE YELLOW 11/02/2009 1300   APPEARANCEUR CLEAR 11/02/2009 1300   LABSPEC 1.012 11/02/2009 1300   PHURINE 7.0 11/02/2009 1300   GLUCOSEU NEGATIVE 11/02/2009 1300   HGBUR NEGATIVE 11/02/2009 1300   BILIRUBINUR neg 10/25/2018 1437   KETONESUR NEGATIVE 11/02/2009 1300   PROTEINUR Positive (A) 10/25/2018 1437   PROTEINUR NEGATIVE 11/02/2009 1300   UROBILINOGEN 0.2 10/25/2018 1437   UROBILINOGEN 0.2 11/02/2009 1300   NITRITE neg 10/25/2018 1437   NITRITE NEGATIVE 11/02/2009 1300   LEUKOCYTESUR Moderate (2+) (A) 10/25/2018 1437    Pertinent Imaging:   Assessment & Plan: Patient has high-volume mixed incontinence.  Role of cystoscopy and urodynamics discussed.  No blood in urine today.  She smoked many years ago.  Role of CT scan discussed.  Patient currently does not have insurance.  We will get a try her on oxybutynin ER 10 mg with 30 tabs 11 refills I will reassess her in 6 weeks.  We will call her and tell her how much a cause for cystoscopy and CT scan and she might be willing to pay but we want to get the numbers first.  I held off on urodynamics because of cost and hopefully she will get insurance in the future.  1. Urinary incontinence, unspecified type  - Urinalysis, Complete   No follow-ups on file.  Reece Packer, MD  [place] [address] [place], [place] [address] [phone]

## 2019-03-20 LAB — CULTURE, URINE COMPREHENSIVE

## 2019-05-06 ENCOUNTER — Ambulatory Visit (INDEPENDENT_AMBULATORY_CARE_PROVIDER_SITE_OTHER): Payer: 59 | Admitting: Urology

## 2019-05-06 ENCOUNTER — Encounter: Payer: Self-pay | Admitting: Urology

## 2019-05-06 ENCOUNTER — Other Ambulatory Visit: Payer: Self-pay

## 2019-05-06 ENCOUNTER — Telehealth: Payer: Self-pay | Admitting: Urology

## 2019-05-06 VITALS — BP 142/87 | HR 92 | Ht 60.0 in | Wt 272.0 lb

## 2019-05-06 DIAGNOSIS — N3946 Mixed incontinence: Secondary | ICD-10-CM

## 2019-05-06 MED ORDER — SULFAMETHOXAZOLE-TRIMETHOPRIM 800-160 MG PO TABS: 1.0000 | ORAL_TABLET | Freq: Two times a day (BID) | ORAL | 0 refills | Status: DC

## 2019-05-06 NOTE — Progress Notes (Signed)
05/06/2019 9:48 AM   Angelica Hess [birth year] [id number]  Referring provider: Jearld Fenton, NP [address] [place],  [place] [address]  Chief Complaint  Patient presents with  . Cysto    HPI: I was consulted to assess the patient's 33-month history of urinary incontinence.  She leaks with coughing sneezing laughing but not bending lifting.  She has urge incontinence with little warning.  Both are significant.  I do not think she has bedwetting.  She wears 6-10 pads a day moderately wet or soaked  She voids every 1 or 2 hours.  In dental office sometimes she has to void every 20 minutes due to urgency.  She cannot hold it for 2 hours.  She described an episode of blood in the urine treated with an antibiotic last July but then the culture apparently was negative  Exam a bit limited due to obesity.  Well supported bladder neck and negative cough test  Patient has high-volume mixed incontinence.  Role of cystoscopy and urodynamics discussed.  No blood in urine today.  She smoked many years ago.  Role of CT scan discussed.  Patient currently does not have insurance.  We will get a try her on oxybutynin ER 10 mg with 30 tabs 11 refills I will reassess her in 6 weeks.  We will call her and tell her how much a cause for cystoscopy and CT scan and she might be willing to pay but we want to get the numbers first.  I held off on urodynamics because of cost and hopefully she will get insurance in the future.  Today Frequency is stable.  No CT scan performed.  Urine culture negative Much less urgency and can hold urination but still pad count similar.  Clinically not infected  Cystoscopy: Patient underwent flexible cystoscopy.  Bladder mucosa and trigone were normal.  No stitch foreign body or carcinoma.  No cystitis.  Patient tolerated procedure well      PMH: Past Medical History:  Diagnosis Date  . Fibromyalgia   . Genital herpes   . Hypertension   . Hypothyroidism   .  Sleep apnea     Surgical History: Past Surgical History:  Procedure Laterality Date  . CESAREAN SECTION    . TEMPOROMANDIBULAR JOINT SURGERY      Home Medications:  Allergies as of 05/06/2019      Reactions   Penicillins Itching   Sulfa Antibiotics    Allergy as a child      Medication List       Accurate as of May 06, 2019  9:48 AM. If you have any questions, ask your nurse or doctor.        fexofenadine 180 MG tablet Commonly known as: ALLEGRA Take 1 tablet (180 mg total) by mouth daily.   ibuprofen 200 MG tablet Commonly known as: ADVIL Take 200 mg by mouth as needed.   levothyroxine 50 MCG tablet Commonly known as: SYNTHROID Take 1 tablet (50 mcg total) by mouth daily.   metoprolol succinate 25 MG 24 hr tablet Commonly known as: TOPROL-XL Take 25 mg by mouth daily.   nitrofurantoin (macrocrystal-monohydrate) 100 MG capsule Commonly known as: MACROBID Take 1 capsule (100 mg total) by mouth 2 (two) times daily.   olmesartan 5 MG tablet Commonly known as: BENICAR Take 1 tablet (5 mg total) by mouth daily.   oxybutynin 10 MG 24 hr tablet Commonly known as: DITROPAN-XL Take 1 tablet (10 mg total) by mouth at  bedtime.   valACYclovir 500 MG tablet Commonly known as: VALTREX TAKE ONE CAPLET BY MOUTH TWICE DAILY PRN FOR OUTBREAKS   VITAMIN B 12 PO Take by mouth.       Allergies:  Allergies  Allergen Reactions  . Penicillins Itching  . Sulfa Antibiotics     Allergy as a child    Family History: Family History  Problem Relation Age of Onset  . Alzheimer's disease Mother   . Breast cancer Mother 25    Social History:  reports that she has quit smoking. She has never used smokeless tobacco. She reports that she does not drink alcohol or use drugs.  ROS:                                        Physical Exam: BP (!) 142/87   Pulse 92   Ht 5' (1.524 m)   Wt 272 lb (123.4 kg)   BMI 53.12 kg/m   Constitutional:   Alert and oriented, No acute distress.   Laboratory Data: Lab Results  Component Value Date   WBC 5.7 05/14/2018   HGB 13.2 05/14/2018   HCT 39.8 05/14/2018   MCV 91.6 05/14/2018   PLT 216.0 05/14/2018    Lab Results  Component Value Date   CREATININE 0.69 05/14/2018    No results found for: PSA  No results found for: TESTOSTERONE  Lab Results  Component Value Date   HGBA1C 5.6 05/14/2018    Urinalysis    Component Value Date/Time   COLORURINE YELLOW 11/02/2009 1300   APPEARANCEUR Clear 03/18/2019 1112   LABSPEC 1.012 11/02/2009 1300   PHURINE 7.0 11/02/2009 1300   GLUCOSEU Negative 03/18/2019 1112   HGBUR NEGATIVE 11/02/2009 1300   BILIRUBINUR Negative 03/18/2019 1112   KETONESUR NEGATIVE 11/02/2009 1300   PROTEINUR Negative 03/18/2019 1112   PROTEINUR NEGATIVE 11/02/2009 1300   UROBILINOGEN 0.2 10/25/2018 1437   UROBILINOGEN 0.2 11/02/2009 1300   NITRITE Negative 03/18/2019 1112   NITRITE NEGATIVE 11/02/2009 1300   LEUKOCYTESUR Negative 03/18/2019 1112    Pertinent Imaging:   Assessment & Plan: Patient still does not have insurance.  Reassess in 6 weeks on oxybutynin ER 10 mg in combination with Myrbetriq 50 mg samples.  Taken into account insurance status  There are no diagnoses linked to this encounter.  No follow-ups on file.  Reece Packer, MD  [facility] [address], [place], [place] [address] [phone]

## 2019-05-06 NOTE — Addendum Note (Signed)
Addended by: Verlene Mayer A on: 05/06/2019 12:20 PM   Modules accepted: Orders

## 2019-05-06 NOTE — Telephone Encounter (Signed)
Pt called and wants to know why she was prescribed Bactrim.She states that he did not mention this to her. She has an allergic reaction to Sulfa meds in the past. Please advise.

## 2019-05-07 LAB — URINALYSIS, COMPLETE
Bilirubin, UA: NEGATIVE
Glucose, UA: NEGATIVE
Ketones, UA: NEGATIVE
Leukocytes,UA: NEGATIVE
Nitrite, UA: NEGATIVE
Protein,UA: NEGATIVE
RBC, UA: NEGATIVE
Specific Gravity, UA: 1.02 (ref 1.005–1.030)
Urobilinogen, Ur: 0.2 mg/dL (ref 0.2–1.0)
pH, UA: 7 (ref 5.0–7.5)

## 2019-05-07 LAB — MICROSCOPIC EXAMINATION
Bacteria, UA: NONE SEEN
RBC: NONE SEEN /hpf (ref 0–2)

## 2019-05-08 LAB — CULTURE, URINE COMPREHENSIVE

## 2019-05-09 NOTE — Telephone Encounter (Signed)
Left patient message

## 2019-05-09 NOTE — Telephone Encounter (Signed)
I looked at the note and agree with you that it appears that she should not be on a sulfa drawn.  She should be on oxybutynin for her urgency.  Perhaps it was sent in for the wrong patient.  Let me know if things are not clear now thank you

## 2019-05-14 NOTE — Telephone Encounter (Signed)
mychart notification sent 

## 2019-06-24 ENCOUNTER — Ambulatory Visit (INDEPENDENT_AMBULATORY_CARE_PROVIDER_SITE_OTHER): Payer: 59 | Admitting: Urology

## 2019-06-24 ENCOUNTER — Encounter: Payer: Self-pay | Admitting: Urology

## 2019-06-24 ENCOUNTER — Other Ambulatory Visit: Payer: Self-pay

## 2019-06-24 VITALS — BP 142/86 | HR 92 | Ht 60.0 in | Wt 272.0 lb

## 2019-06-24 DIAGNOSIS — N3946 Mixed incontinence: Secondary | ICD-10-CM

## 2019-06-24 NOTE — Progress Notes (Signed)
06/24/2019 9:41 AM   Angelica Hess 29-May-1955 [id number]  Referring provider: Jearld Fenton, NP [address] [place],  [place] [address]  Chief Complaint  Patient presents with  . Follow-up    HPI: I was consulted to assess the patient's 12-month history of urinary incontinence.She leaks with coughing sneezing laughing but not bending lifting. She has urge incontinence with little warning. Both are significant. I do not think she has bedwetting. She wears 6-10 pads a day moderately wet or soaked.  She reported a distant history of blood in the urine  She voids every 1 or 2 hours.In dental officesometimes she has to void every 20 minutes due to urgency. She cannot hold it for 2 hours.  Exam a bit limited due to obesity. Well supported bladder neck and negative cough test  Patient has high-volume mixed incontinence. Role of cystoscopy and urodynamics discussed. No blood in urine today. She smoked many years ago. Role of CT scan discussed. Patient currently does not have insurance. We will get a try her on oxybutynin ER 10 mg with 30 tabs 11 refills I will reassess her in 6 weeks. We will call her and tell her how much it costs for cystoscopy and CT scan and she might be willing to pay but we want to get the numbers first. I held off on urodynamics because of cost and hopefully she will get insurance in the future.  No CT scan performed.  Urine culture negative Much less urgency and can hold urination but still pad count similar.  Clinically not infected  Cystoscopy: Normal  Patient still does not have insurance.  Reassess in 6 weeks on oxybutynin ER 10 mg in combination with Myrbetriq 50 mg samples.  Taken into account insurance status  Today Frequency stable Patient is dramatically better by 80 to 90% on combination treatment.  The samples ran out she cannot afford them due to no insurance.  She still on oxybutynin with some improvement.  Clinically not  infected   PMH: Past Medical History:  Diagnosis Date  . Fibromyalgia   . Genital herpes   . Hypertension   . Hypothyroidism   . Sleep apnea     Surgical History: Past Surgical History:  Procedure Laterality Date  . CESAREAN SECTION    . TEMPOROMANDIBULAR JOINT SURGERY      Home Medications:  Allergies as of 06/24/2019      Reactions   Penicillins Itching   Sulfa Antibiotics    Allergy as a child      Medication List       Accurate as of June 24, 2019  9:41 AM. If you have any questions, ask your nurse or doctor.        STOP taking these medications   nitrofurantoin (macrocrystal-monohydrate) 100 MG capsule Commonly known as: MACROBID Stopped by: Reece Packer, MD   oxybutynin 10 MG 24 hr tablet Commonly known as: DITROPAN-XL Stopped by: Reece Packer, MD   sulfamethoxazole-trimethoprim 800-160 MG tablet Commonly known as: BACTRIM DS Stopped by: Reece Packer, MD   VITAMIN B 12 PO Stopped by: Reece Packer, MD     TAKE these medications   fexofenadine 180 MG tablet Commonly known as: ALLEGRA Take 1 tablet (180 mg total) by mouth daily.   ibuprofen 200 MG tablet Commonly known as: ADVIL Take 200 mg by mouth as needed.   levothyroxine 50 MCG tablet Commonly known as: SYNTHROID Take 1 tablet (50 mcg total) by mouth  daily.   metoprolol succinate 25 MG 24 hr tablet Commonly known as: TOPROL-XL Take 25 mg by mouth daily.   olmesartan 5 MG tablet Commonly known as: BENICAR Take 1 tablet (5 mg total) by mouth daily.   valACYclovir 500 MG tablet Commonly known as: VALTREX TAKE ONE CAPLET BY MOUTH TWICE DAILY PRN FOR OUTBREAKS       Allergies:  Allergies  Allergen Reactions  . Penicillins Itching  . Sulfa Antibiotics     Allergy as a child    Family History: Family History  Problem Relation Age of Onset  . Alzheimer's disease Mother   . Breast cancer Mother 32    Social History:  reports that she has quit  smoking. She has never used smokeless tobacco. She reports that she does not drink alcohol or use drugs.  ROS:                                        Physical Exam: BP (!) 142/86   Pulse 92   Ht 5' (1.524 m)   Wt 272 lb (123.4 kg)   BMI 53.12 kg/m     Laboratory Data: Lab Results  Component Value Date   WBC 5.7 05/14/2018   HGB 13.2 05/14/2018   HCT 39.8 05/14/2018   MCV 91.6 05/14/2018   PLT 216.0 05/14/2018    Lab Results  Component Value Date   CREATININE 0.69 05/14/2018    No results found for: PSA  No results found for: TESTOSTERONE  Lab Results  Component Value Date   HGBA1C 5.6 05/14/2018    Urinalysis    Component Value Date/Time   COLORURINE YELLOW 11/02/2009 1300   APPEARANCEUR Clear 05/06/2019 1257   LABSPEC 1.012 11/02/2009 1300   PHURINE 7.0 11/02/2009 1300   GLUCOSEU Negative 05/06/2019 1257   HGBUR NEGATIVE 11/02/2009 1300   BILIRUBINUR Negative 05/06/2019 1257   KETONESUR NEGATIVE 11/02/2009 1300   PROTEINUR Negative 05/06/2019 1257   PROTEINUR NEGATIVE 11/02/2009 1300   UROBILINOGEN 0.2 10/25/2018 1437   UROBILINOGEN 0.2 11/02/2009 1300   NITRITE Negative 05/06/2019 1257   NITRITE NEGATIVE 11/02/2009 1300   LEUKOCYTESUR Negative 05/06/2019 1257    Pertinent Imaging:   Assessment & Plan: 2 months of samples given.  Take every second day.  Take with oxybutynin.  Reassess 4 months  There are no diagnoses linked to this encounter.  No follow-ups on file.  Reece Packer, MD  [facility] [address], [place], [place] [address] [phone]

## 2019-09-07 ENCOUNTER — Ambulatory Visit: Payer: Self-pay | Attending: Internal Medicine

## 2019-09-07 DIAGNOSIS — Z23 Encounter for immunization: Secondary | ICD-10-CM

## 2019-09-07 NOTE — Progress Notes (Signed)
   Covid-19 Vaccination Clinic  Name:  Angelica Hess    MRN: [id number] DOB: 1956-04-18  09/07/2019  [name] was observed post Covid-19 immunization for 15 minutes without incident. She was provided with Vaccine Information Sheet and instruction to access the V-Safe system.   [name] was instructed to call 911 with any severe reactions post vaccine: Marland Kitchen Difficulty breathing  . Swelling of face and throat  . A fast heartbeat  . A bad rash all over body  . Dizziness and weakness   Immunizations Administered    Name Date Dose VIS Date Route   Pfizer COVID-19 Vaccine 09/07/2019  9:08 AM 0.3 mL 05/03/2019 Intramuscular   Manufacturer: [employer]   Lot: [id number]   [place]: [id number]

## 2019-10-02 ENCOUNTER — Ambulatory Visit: Payer: Self-pay | Attending: Internal Medicine

## 2019-10-02 DIAGNOSIS — Z23 Encounter for immunization: Secondary | ICD-10-CM

## 2019-10-02 NOTE — Progress Notes (Signed)
   Covid-19 Vaccination Clinic  Name:  Angelica Hess    MRN: [id number] DOB: [birth year]  10/02/2019  Angelica Hess was observed post Covid-19 immunization for 15 minutes without incident. She was provided with Vaccine Information Sheet and instruction to access the V-Safe system.   Angelica Hess was instructed to call 911 with any severe reactions post vaccine: Marland Kitchen Difficulty breathing  . Swelling of face and throat  . A fast heartbeat  . A bad rash all over body  . Dizziness and weakness   Immunizations Administered    Name Date Dose VIS Date Route   Pfizer COVID-19 Vaccine 10/02/2019  4:59 PM 0.3 mL 07/17/2018 Intramuscular   Manufacturer: [place]   Lot: [id number]   [place]: [id number]

## 2019-10-22 ENCOUNTER — Ambulatory Visit: Payer: HRSA Program | Attending: Internal Medicine

## 2019-10-22 ENCOUNTER — Other Ambulatory Visit: Payer: Self-pay

## 2019-10-22 DIAGNOSIS — U071 COVID-19: Secondary | ICD-10-CM | POA: Insufficient documentation

## 2019-10-22 DIAGNOSIS — Z20822 Contact with and (suspected) exposure to covid-19: Secondary | ICD-10-CM | POA: Diagnosis present

## 2019-10-23 ENCOUNTER — Telehealth: Payer: Self-pay

## 2019-10-23 LAB — NOVEL CORONAVIRUS, NAA: SARS-CoV-2, NAA: DETECTED — AB

## 2019-10-23 LAB — SARS-COV-2, NAA 2 DAY TAT

## 2019-10-23 NOTE — Telephone Encounter (Signed)
Received call from patient checking Covid results.  Advised results are positive.  Advised to contact PCP or visit ED if symptoms worsen.

## 2019-10-28 ENCOUNTER — Telehealth (INDEPENDENT_AMBULATORY_CARE_PROVIDER_SITE_OTHER): Payer: Self-pay | Admitting: Urology

## 2019-10-28 DIAGNOSIS — N3946 Mixed incontinence: Secondary | ICD-10-CM

## 2019-10-28 NOTE — Progress Notes (Signed)
Virtual Visit via Telephone Note  I connected with Angelica Hess on 10/28/19 at  9:30 AM EDT by telephone and verified that I am speaking with the correct person using two identifiers.   I discussed the limitations, risks, security and privacy concerns of performing an evaluation and management service by telephone and the availability of in person appointments. We discussed the impact of the COVID-19 pandemic on the healthcare system, and the importance of social distancing and reducing patient and provider exposure. I also discussed with the patient that there may be a patient responsible charge related to this service. The patient expressed understanding and agreed to proceed.  Reason for visit: Reviewed note: incontinence follow up  History of Present Illness: Incontinence dramatically better on Myrbetriq 50 mg and oxybutynin.  Clinically no infections.  Frequency stable  Assessment and Plan:   Follow Up: I gave her 3 months of samples at the front desk.  This should get her for 6 months when she gets her insurance in December.  I will see her in a year.  She will call for prescription.    I discussed the assessment and treatment plan with the patient. The patient was provided an opportunity to ask questions and all were answered. The patient agreed with the plan and demonstrated an understanding of the instructions.   The patient was advised to call back or seek an in-person evaluation if the symptoms worsen or if the condition fails to improve as anticipated.  I provided 10 minutes of non-face-to-face time during this encounter.   Reece Packer, MD

## 2020-04-02 ENCOUNTER — Other Ambulatory Visit: Payer: Self-pay

## 2020-04-02 MED ORDER — OXYBUTYNIN CHLORIDE ER 10 MG PO TB24
10.0000 mg | ORAL_TABLET | Freq: Every day | ORAL | 3 refills | Status: DC
Start: 2020-04-02 — End: 2021-06-18

## 2020-08-08 IMAGING — MG DIGITAL SCREENING BILATERAL MAMMOGRAM WITH CAD
4 series · 4 of 4 positions shown · non-contrast
Comparison: Previous exam(s).

CLINICAL DATA: Screening.

EXAM:
DIGITAL SCREENING BILATERAL MAMMOGRAM WITH CAD

[R MLO]
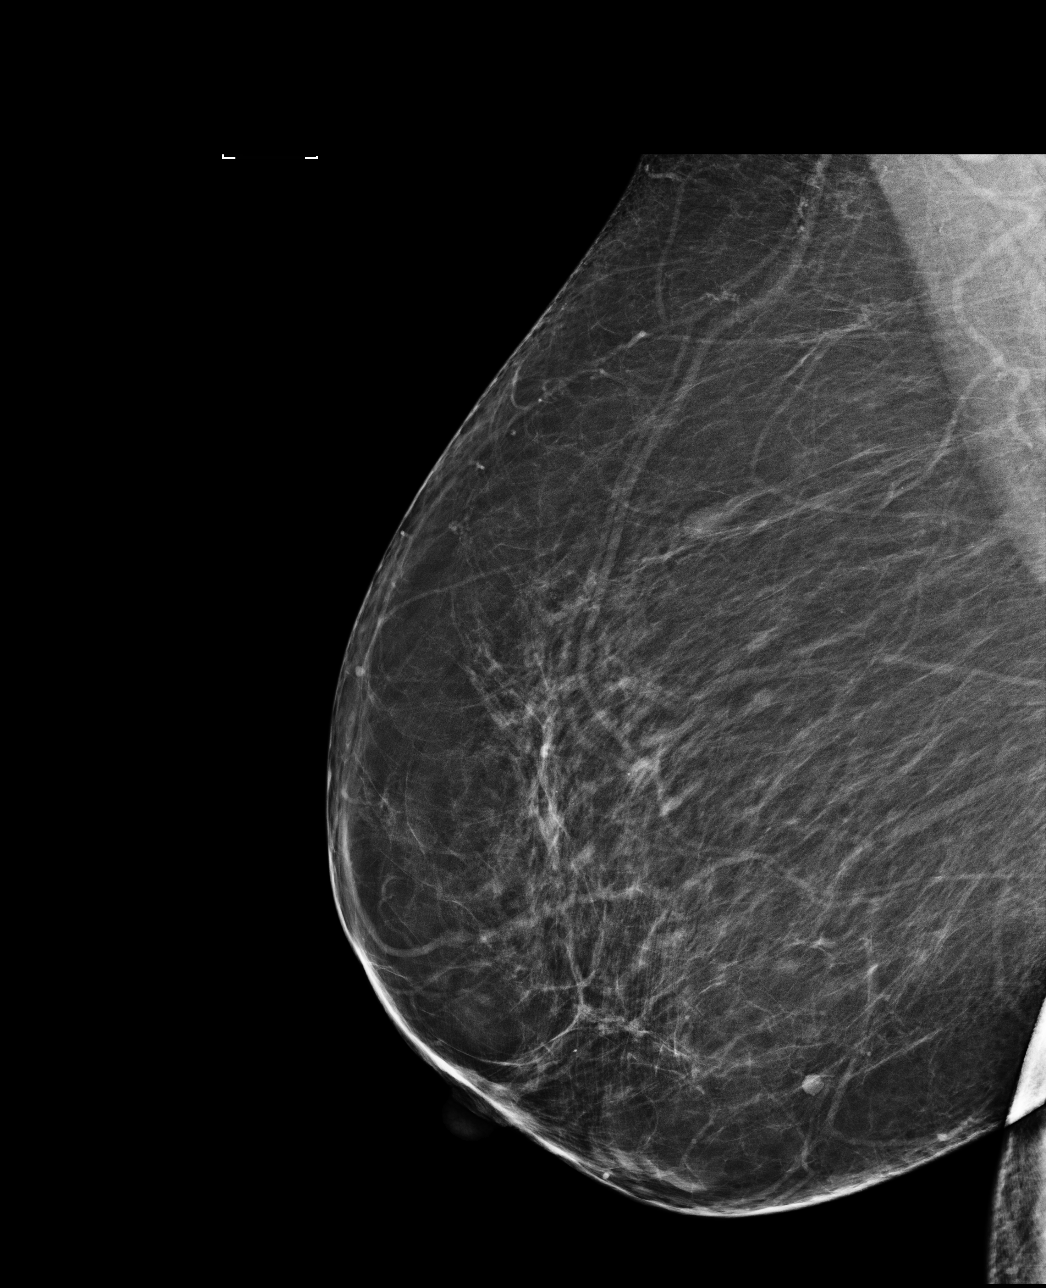

[R CC]
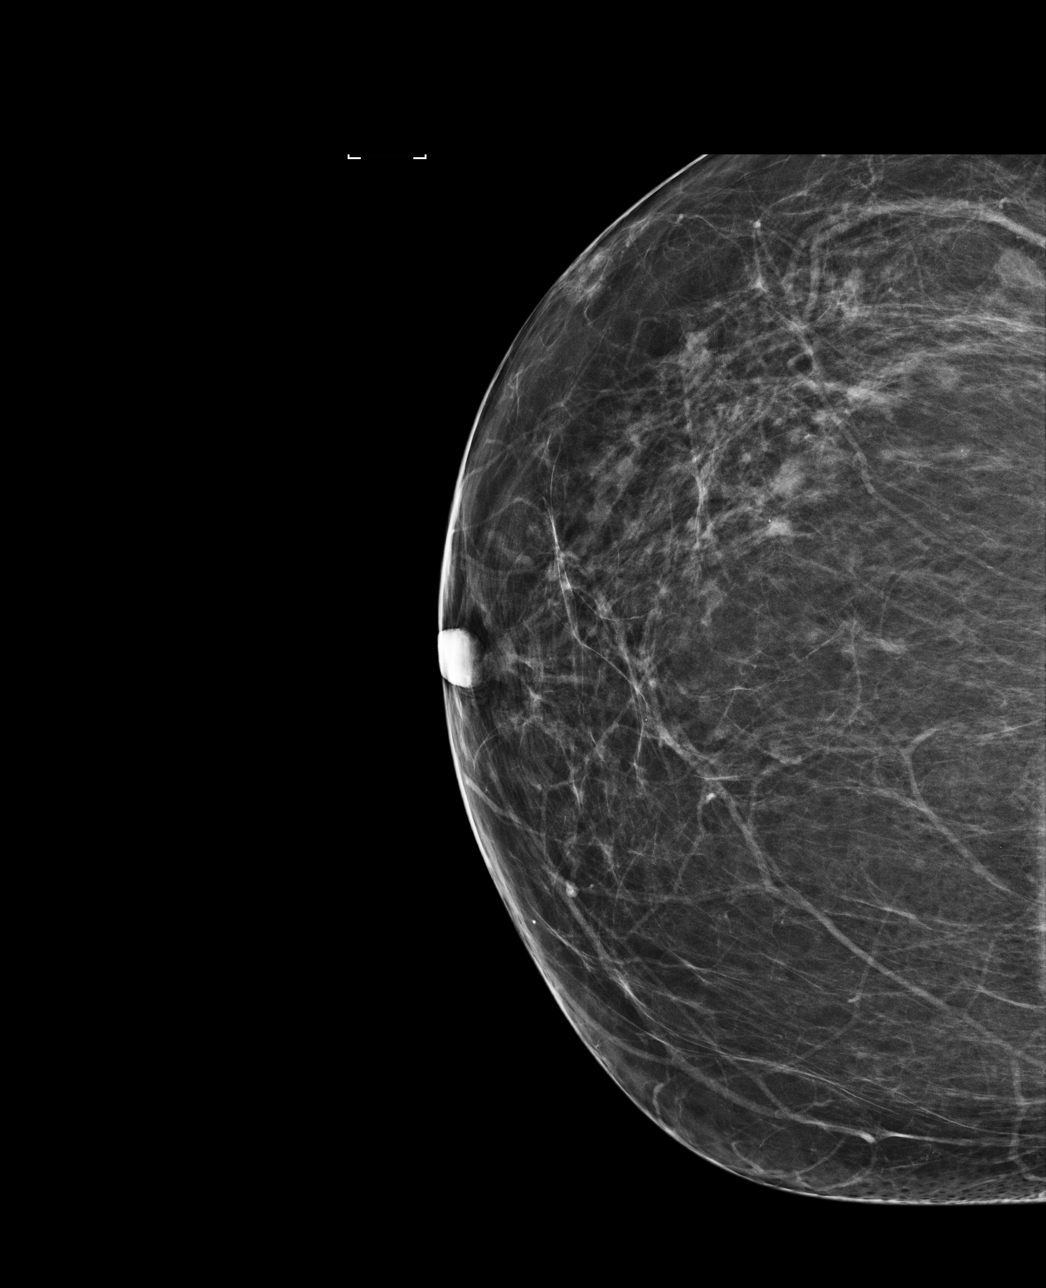

[L MLO]
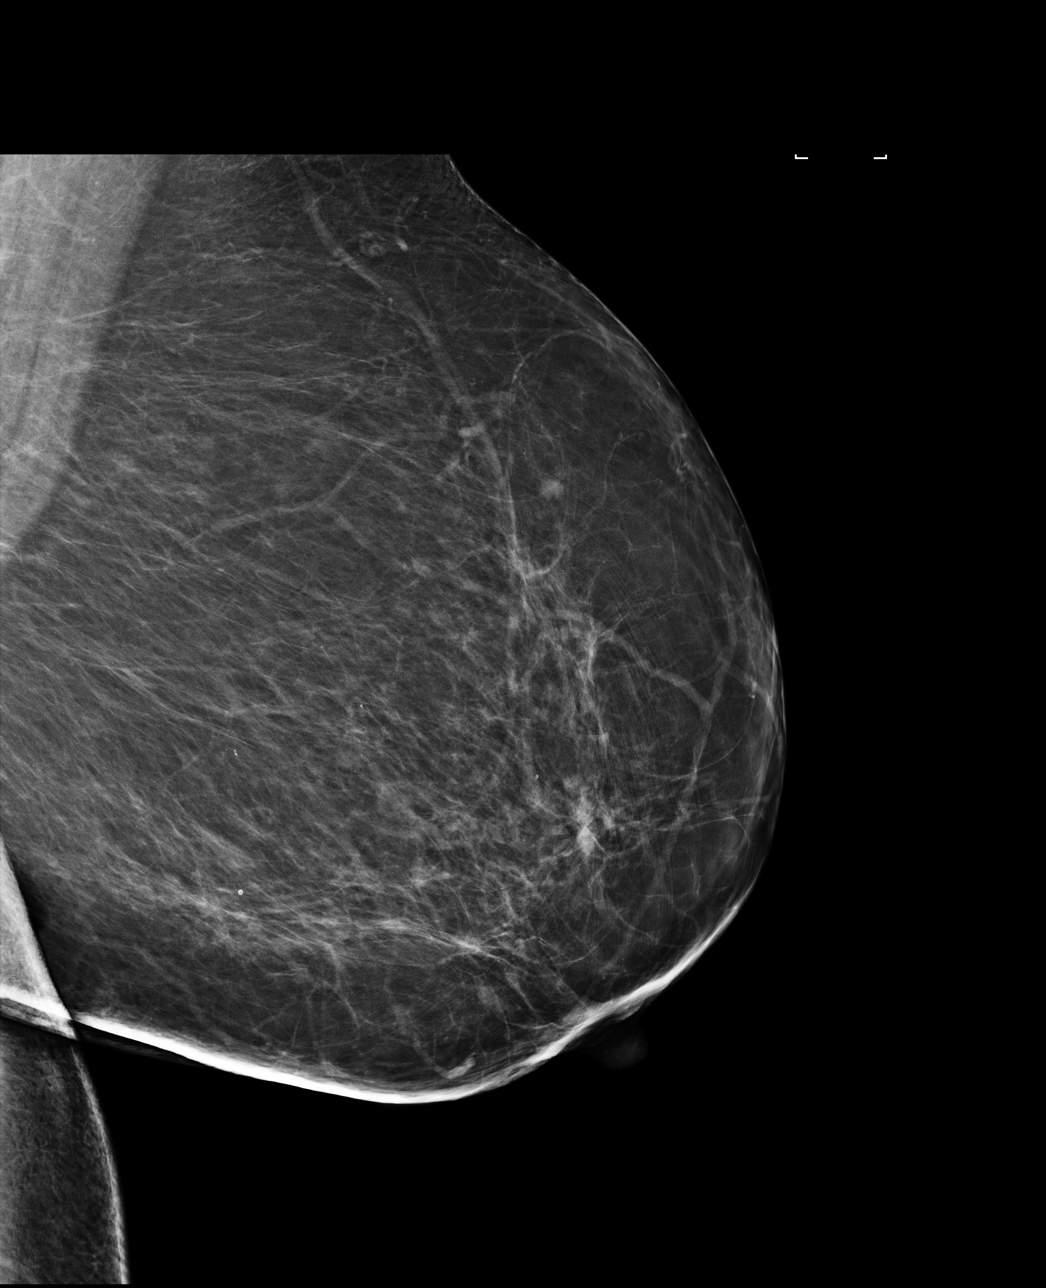

[L CC]
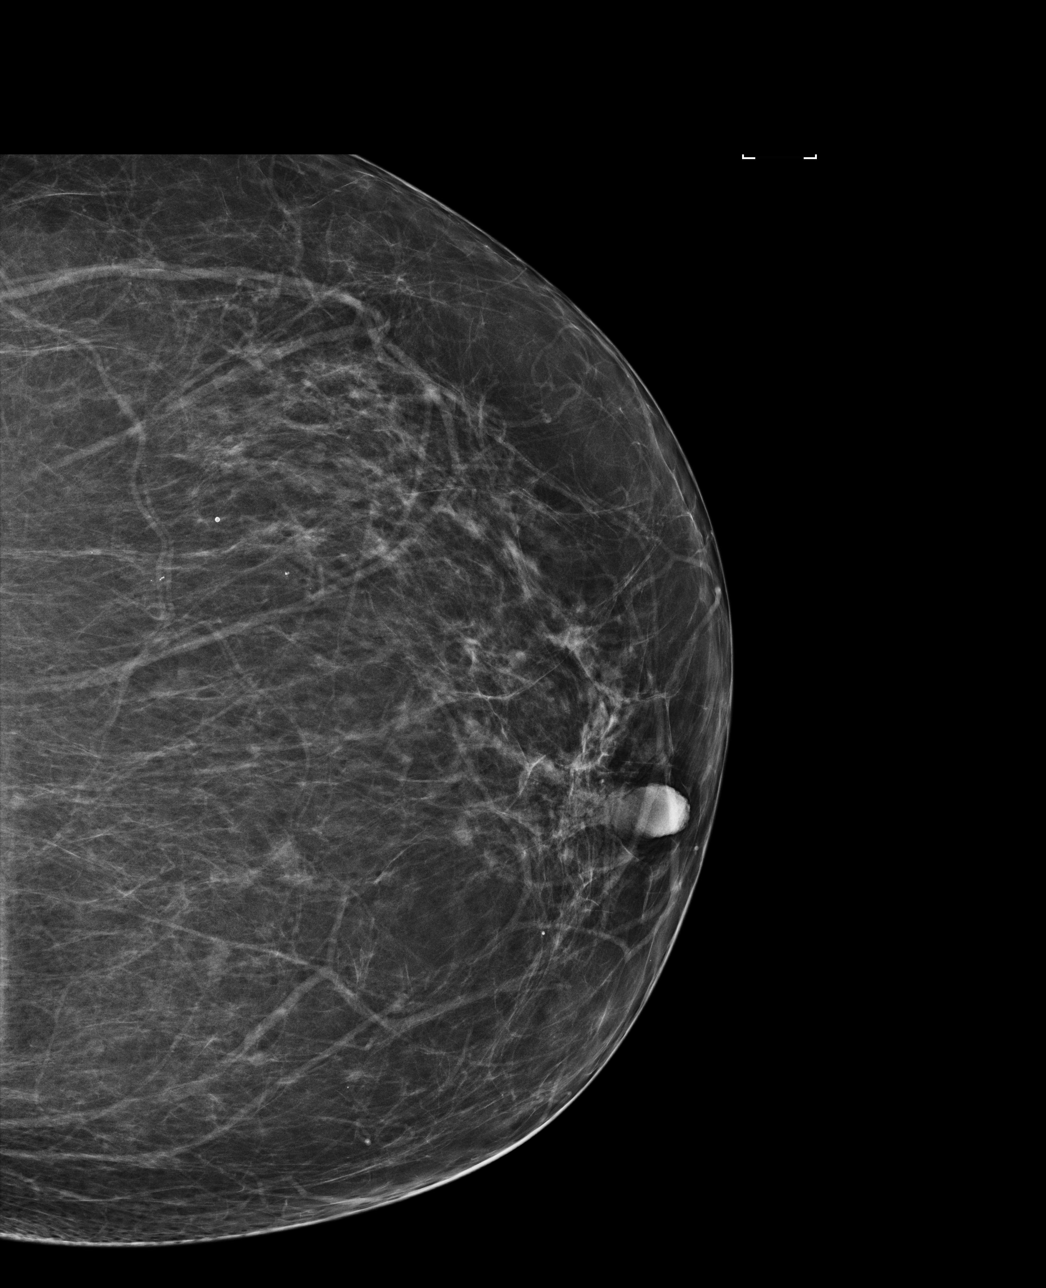

[4 of 4 positions shown; findings below may reference images not displayed]

ACR Breast Density Category b: There are scattered areas of
fibroglandular density.
FINDINGS: There are no findings suspicious for malignancy. Images were
processed with CAD.
IMPRESSION: No mammographic evidence of malignancy. A result letter of this
screening mammogram will be mailed directly to the patient.

RECOMMENDATION:
Screening mammogram in one year. (Code:AS-G-LCT)

BI-RADS CATEGORY  1: Negative.

## 2020-08-24 ENCOUNTER — Other Ambulatory Visit: Payer: Self-pay | Admitting: Internal Medicine

## 2020-08-24 DIAGNOSIS — Z1231 Encounter for screening mammogram for malignant neoplasm of breast: Secondary | ICD-10-CM

## 2020-11-02 ENCOUNTER — Other Ambulatory Visit: Payer: Self-pay

## 2020-11-02 ENCOUNTER — Ambulatory Visit (INDEPENDENT_AMBULATORY_CARE_PROVIDER_SITE_OTHER): Payer: Self-pay | Admitting: Urology

## 2020-11-02 ENCOUNTER — Encounter: Payer: Self-pay | Admitting: Urology

## 2020-11-02 VITALS — BP 180/104 | HR 56 | Ht 60.0 in | Wt 272.0 lb

## 2020-11-02 DIAGNOSIS — N3946 Mixed incontinence: Secondary | ICD-10-CM

## 2020-11-02 MED ORDER — OXYBUTYNIN CHLORIDE ER 10 MG PO TB24: 10.0000 mg | ORAL_TABLET | Freq: Every day | ORAL | 3 refills | Status: DC

## 2020-11-02 NOTE — Progress Notes (Signed)
11/02/2020 9:33 AM   Angelica Hess 02-27-1956 [id number]  Referring provider: Idelle Crouch, MD [facility] [place],  [place] [address]  Chief Complaint  Patient presents with   Urinary Incontinence    HPI: I was consulted to assess the patient's 35-month history of urinary incontinence.  She leaks with coughing sneezing laughing but not bending lifting.  She has urge incontinence with little warning.  Both are significant.  I do not think she has bedwetting.  She wears 6-10 pads a day moderately wet or soaked.  She reported a distant history of blood in the urine   She voids every 1 or 2 hours.  In dental office sometimes she has to void every 20 minutes due to urgency.  She cannot hold it for 2 hours.   Exam a bit limited due to obesity.  Well supported bladder neck and negative cough test   Patient has high-volume mixed incontinence.  Role of cystoscopy and urodynamics discussed.  No blood in urine today.  She smoked many years ago.  Role of CT scan discussed.  Patient currently does not have insurance.  We will get a try her on oxybutynin ER 10 mg with 30 tabs 11 refills I will reassess her in 6 weeks.  We will call her and tell her how much it costs for cystoscopy and CT scan and she might be willing to pay but we want to get the numbers first.  I held off on urodynamics because of cost and hopefully she will get insurance in the future.   No CT scan performed.  Urine culture negative Much less urgency and can hold urination but still pad count similar.  Clinically not infected   Cystoscopy: Normal   Patient still does not have insurance.  Reassess in 6 weeks on oxybutynin ER 10 mg in combination with Myrbetriq 50 mg samples.  Taken into account insurance status   Today Frequency stable Patient is dramatically better by 80 to 90% on combination treatment.  The samples ran out she cannot afford them due to no insurance.  She still on oxybutynin with  some improvement.  Clinically not infected  Today Frequency stable.  Urge incontinence dramatically better.  No infections.  I gave 3 months of Myrbetriq samples.  She will come in every 3 months and picks them up.  I called in oxybutynin 30x11.   PMH: Past Medical History:  Diagnosis Date   Fibromyalgia    Genital herpes    Hypertension    Hypothyroidism    Sleep apnea     Surgical History: Past Surgical History:  Procedure Laterality Date   CESAREAN SECTION     TEMPOROMANDIBULAR JOINT SURGERY      Home Medications:  Allergies as of 11/02/2020       Reactions   Penicillins Itching   Sulfa Antibiotics    Allergy as a child        Medication List        Accurate as of November 02, 2020  9:33 AM. If you have any questions, ask your nurse or doctor.          fexofenadine 180 MG tablet Commonly known as: ALLEGRA Take 1 tablet (180 mg total) by mouth daily.   hydrochlorothiazide 25 MG tablet Commonly known as: HYDRODIURIL Take 1 tablet by mouth daily.   ibuprofen 200 MG tablet Commonly known as: ADVIL Take 200 mg by mouth as needed.   levothyroxine 50 MCG tablet  Commonly known as: SYNTHROID Take 1 tablet (50 mcg total) by mouth daily.   levothyroxine 50 MCG tablet Commonly known as: SYNTHROID Take by mouth.   metoprolol succinate 25 MG 24 hr tablet Commonly known as: TOPROL-XL Take 25 mg by mouth daily.   metoprolol tartrate 25 MG tablet Commonly known as: LOPRESSOR Take 25 mg by mouth 2 (two) times daily.   olmesartan 5 MG tablet Commonly known as: BENICAR Take 1 tablet (5 mg total) by mouth daily.   olmesartan 40 MG tablet Commonly known as: BENICAR Take 40 mg by mouth daily.   oxybutynin 10 MG 24 hr tablet Commonly known as: DITROPAN-XL Take 1 tablet (10 mg total) by mouth daily.   topiramate 50 MG tablet Commonly known as: TOPAMAX Take 50 mg by mouth 2 (two) times daily.   valACYclovir 500 MG tablet Commonly known as: VALTREX Take 1  tablet by mouth 2 (two) times daily.        Allergies:  Allergies  Allergen Reactions   Penicillins Itching   Sulfa Antibiotics     Allergy as a child    Family History: Family History  Problem Relation Age of Onset   Alzheimer's disease Mother    Breast cancer Mother 34    Social History:  reports that she has quit smoking. She has never used smokeless tobacco. She reports that she does not drink alcohol and does not use drugs.  ROS:                                        Physical Exam: BP (!) 180/104   Pulse (!) 56   Ht 5' (1.524 m)   Wt 123.4 kg   BMI 53.12 kg/m   Constitutional:  Alert and oriented, No acute distress.   Laboratory Data: Lab Results  Component Value Date   WBC 5.7 05/14/2018   HGB 13.2 05/14/2018   HCT 39.8 05/14/2018   MCV 91.6 05/14/2018   PLT 216.0 05/14/2018    Lab Results  Component Value Date   CREATININE 0.69 05/14/2018    No results found for: PSA  No results found for: TESTOSTERONE  Lab Results  Component Value Date   HGBA1C 5.6 05/14/2018    Urinalysis    Component Value Date/Time   COLORURINE YELLOW 11/02/2009 1300   APPEARANCEUR Clear 05/06/2019 1257   LABSPEC 1.012 11/02/2009 1300   PHURINE 7.0 11/02/2009 1300   GLUCOSEU Negative 05/06/2019 1257   HGBUR NEGATIVE 11/02/2009 1300   BILIRUBINUR Negative 05/06/2019 1257   KETONESUR NEGATIVE 11/02/2009 1300   PROTEINUR Negative 05/06/2019 1257   PROTEINUR NEGATIVE 11/02/2009 1300   UROBILINOGEN 0.2 10/25/2018 1437   UROBILINOGEN 0.2 11/02/2009 1300   NITRITE Negative 05/06/2019 1257   NITRITE NEGATIVE 11/02/2009 1300   LEUKOCYTESUR Negative 05/06/2019 1257    Pertinent Imaging:   Assessment & Plan: As noted above.  See in 1 year  There are no diagnoses linked to this encounter.  No follow-ups on file.  Reece Packer, MD  [facility] [address] [place], [place] [address] [phone]

## 2021-05-30 ENCOUNTER — Encounter (HOSPITAL_COMMUNITY): Payer: Self-pay | Admitting: Emergency Medicine

## 2021-05-30 ENCOUNTER — Emergency Department (HOSPITAL_COMMUNITY): Payer: Self-pay

## 2021-05-30 ENCOUNTER — Emergency Department (HOSPITAL_COMMUNITY)
Admission: EM | Admit: 2021-05-30 | Discharge: 2021-05-30 | Disposition: A | Discharge status: Home / Self Care | Payer: Self-pay | Attending: Emergency Medicine | Admitting: Emergency Medicine

## 2021-05-30 DIAGNOSIS — E039 Hypothyroidism, unspecified: Secondary | ICD-10-CM | POA: Insufficient documentation

## 2021-05-30 DIAGNOSIS — N200 Calculus of kidney: Secondary | ICD-10-CM | POA: Insufficient documentation

## 2021-05-30 DIAGNOSIS — I10 Essential (primary) hypertension: Secondary | ICD-10-CM | POA: Insufficient documentation

## 2021-05-30 DIAGNOSIS — Z79899 Other long term (current) drug therapy: Secondary | ICD-10-CM | POA: Insufficient documentation

## 2021-05-30 DIAGNOSIS — Z20822 Contact with and (suspected) exposure to covid-19: Secondary | ICD-10-CM | POA: Insufficient documentation

## 2021-05-30 LAB — URINALYSIS, MICROSCOPIC (REFLEX): Bacteria, UA: NONE SEEN

## 2021-05-30 LAB — CBC
HCT: 44.1 % (ref 36.0–46.0)
Hemoglobin: 14.3 g/dL (ref 12.0–15.0)
MCH: 31.4 pg (ref 26.0–34.0)
MCHC: 32.4 g/dL (ref 30.0–36.0)
MCV: 96.9 fL (ref 80.0–100.0)
Platelets: 202 10*3/uL (ref 150–400)
RBC: 4.55 MIL/uL (ref 3.87–5.11)
RDW: 13.2 % (ref 11.5–15.5)
WBC: 7.4 10*3/uL (ref 4.0–10.5)
nRBC: 0 % (ref 0.0–0.2)

## 2021-05-30 LAB — COMPREHENSIVE METABOLIC PANEL
ALT: 17 U/L (ref 0–44)
AST: 14 U/L — ABNORMAL LOW (ref 15–41)
Albumin: 3.5 g/dL (ref 3.5–5.0)
Alkaline Phosphatase: 95 U/L (ref 38–126)
Anion gap: 11 (ref 5–15)
BUN: 20 mg/dL (ref 8–23)
CO2: 25 mmol/L (ref 22–32)
Calcium: 10.1 mg/dL (ref 8.9–10.3)
Chloride: 105 mmol/L (ref 98–111)
Creatinine, Ser: 1.14 mg/dL — ABNORMAL HIGH (ref 0.44–1.00)
GFR, Estimated: 53 mL/min — ABNORMAL LOW (ref >60)
Glucose, Bld: 139 mg/dL — ABNORMAL HIGH (ref 70–99)
Potassium: 3.9 mmol/L (ref 3.5–5.1)
Sodium: 141 mmol/L (ref 135–145)
Total Bilirubin: 0.6 mg/dL (ref 0.3–1.2)
Total Protein: 6.6 g/dL (ref 6.5–8.1)

## 2021-05-30 LAB — RESP PANEL BY RT-PCR (FLU A&B, COVID) ARPGX2
Influenza A by PCR: NEGATIVE
Influenza B by PCR: NEGATIVE
SARS Coronavirus 2 by RT PCR: NEGATIVE

## 2021-05-30 LAB — URINALYSIS, ROUTINE W REFLEX MICROSCOPIC
Bilirubin Urine: NEGATIVE
Glucose, UA: NEGATIVE mg/dL
Ketones, ur: 15 mg/dL — AB
Leukocytes,Ua: NEGATIVE
Nitrite: NEGATIVE
Protein, ur: NEGATIVE mg/dL
Specific Gravity, Urine: 1.01 (ref 1.005–1.030)
pH: 7 (ref 5.0–8.0)

## 2021-05-30 LAB — LIPASE, BLOOD: Lipase: 26 U/L (ref 11–51)

## 2021-05-30 MED ORDER — HYDROMORPHONE HCL 1 MG/ML IJ SOLN
1.0000 mg | Freq: Once | INTRAMUSCULAR | Status: AC
  Administered 2021-05-30: 1 mg via INTRAVENOUS
  Filled 2021-05-30: qty 1

## 2021-05-30 MED ORDER — ONDANSETRON 4 MG PO TBDP
4.0000 mg | ORAL_TABLET | Freq: Once | ORAL | Status: AC
  Administered 2021-05-30: 4 mg via ORAL
  Filled 2021-05-30: qty 1

## 2021-05-30 MED ORDER — ONDANSETRON HCL 4 MG PO TABS: 4.0000 mg | ORAL_TABLET | Freq: Four times a day (QID) | ORAL | 0 refills | Status: DC

## 2021-05-30 MED ORDER — LACTATED RINGERS IV BOLUS
1000.0000 mL | Freq: Once | INTRAVENOUS | Status: AC
Start: 2021-05-30 — End: 2021-05-30
  Administered 2021-05-30: 1000 mL via INTRAVENOUS

## 2021-05-30 MED ORDER — OXYCODONE-ACETAMINOPHEN 5-325 MG PO TABS: 1.0000 | ORAL_TABLET | Freq: Four times a day (QID) | ORAL | 0 refills | Status: DC | PRN

## 2021-05-30 MED ORDER — MORPHINE SULFATE (PF) 4 MG/ML IV SOLN
4.0000 mg | Freq: Once | INTRAVENOUS | Status: AC
  Administered 2021-05-30: 4 mg via INTRAVENOUS
  Filled 2021-05-30: qty 1

## 2021-05-30 MED ORDER — KETOROLAC TROMETHAMINE 15 MG/ML IJ SOLN
15.0000 mg | Freq: Once | INTRAMUSCULAR | Status: AC
Start: 2021-05-30 — End: 2021-05-30
  Administered 2021-05-30: 15 mg via INTRAVENOUS
  Filled 2021-05-30: qty 1

## 2021-05-30 MED ORDER — IOHEXOL 300 MG/ML  SOLN
100.0000 mL | Freq: Once | INTRAMUSCULAR | Status: AC | PRN
  Administered 2021-05-30: 100 mL via INTRAVENOUS

## 2021-05-30 NOTE — ED Triage Notes (Signed)
Pt reports right sided pain. States she was having n/v/diarrhea yesterday.

## 2021-05-30 NOTE — ED Notes (Signed)
Discharge instructions, follow up care, and prescriptions reviewed and explained, pt verbalized understanding.  

## 2021-05-30 NOTE — ED Provider Notes (Signed)
[facility] EMERGENCY DEPARTMENT Provider Note   CSN: [id number] Arrival date & time: 05/30/21  1220     History  Chief Complaint  Patient presents with   Abdominal Pain    Angelica Hess is a 66 y.o. female.  Patient is a 66 year old female with a history of hypertension, hypothyroidism who is presenting today with complaint of nausea, vomiting, diarrhea that started yesterday and now severe right side and abdominal pain.  She reports yesterday the nausea vomiting and diarrhea started first but then she started having the pain.  The pain has been present all day long and is seem to be gradually worsening.  She reports its a 12 out of 10 and very severe.  Nothing seems to make it better or worse.  Movement does not affect it, because of the vomiting she has not been able to eat anything but she reports there is no nausea currently.  She feels like she has been running fever at home as well.  She denies any urinary symptoms.  No recent change in medications.  No prior abdominal surgeries other than a C-section years ago.  No history of similar symptoms.  The history is provided by the patient.  Abdominal Pain Pain location:  R flank and RLQ Pain quality: sharp, shooting and throbbing       Home Medications Prior to Admission medications   Medication Sig Start Date End Date Taking? Authorizing Provider  ondansetron (ZOFRAN) 4 MG tablet Take 1 tablet (4 mg total) by mouth every 6 (six) hours. 05/30/21  Yes Blanchie Dessert, MD  oxyCODONE-acetaminophen (PERCOCET/ROXICET) 5-325 MG tablet Take 1 tablet by mouth every 6 (six) hours as needed for severe pain. 05/30/21  Yes Talyn Dessert, Loree Fee, MD  fexofenadine (ALLEGRA) 180 MG tablet Take 1 tablet (180 mg total) by mouth daily. 05/15/18   Jearld Fenton, NP  hydrochlorothiazide (HYDRODIURIL) 25 MG tablet Take 1 tablet by mouth daily. 08/22/20   [provider]  ibuprofen (ADVIL,MOTRIN) 200 MG tablet Take 200 mg by mouth as  needed.    [provider]  levothyroxine (SYNTHROID) 50 MCG tablet Take by mouth. 08/21/20   [provider]  levothyroxine (SYNTHROID, LEVOTHROID) 50 MCG tablet Take 1 tablet (50 mcg total) by mouth daily. 05/15/18   Jearld Fenton, NP  metoprolol succinate (TOPROL-XL) 25 MG 24 hr tablet Take 25 mg by mouth daily.    [provider]  metoprolol tartrate (LOPRESSOR) 25 MG tablet Take 25 mg by mouth 2 (two) times daily. 08/21/20   [provider]  mirabegron ER (MYRBETRIQ) 50 MG TB24 tablet Take 50 mg by mouth every other day.    [provider]  olmesartan (BENICAR) 40 MG tablet Take 40 mg by mouth daily. 09/10/20   [provider]  olmesartan (BENICAR) 5 MG tablet Take 1 tablet (5 mg total) by mouth daily. 05/15/18   Jearld Fenton, NP  oxybutynin (DITROPAN-XL) 10 MG 24 hr tablet Take 1 tablet (10 mg total) by mouth daily. 04/02/20   Zara Council A, PA-C  oxybutynin (DITROPAN-XL) 10 MG 24 hr tablet Take 1 tablet (10 mg total) by mouth daily. 11/02/20   Bjorn Loser, MD  topiramate (TOPAMAX) 50 MG tablet Take 50 mg by mouth 2 (two) times daily. 10/07/20   [provider]  valACYclovir (VALTREX) 500 MG tablet Take 1 tablet by mouth 2 (two) times daily. 08/21/20   [provider]      Allergies    Penicillins  and Sulfa antibiotics    Review of Systems   Review of Systems  Gastrointestinal:  Positive for abdominal pain.   Physical Exam Updated Vital Signs BP (!) 188/84 (BP Location: Right Wrist)    Pulse 79    Temp 98.5 F (36.9 C) (Oral)    Resp 20    SpO2 97%  Physical Exam Vitals and nursing note reviewed.  Constitutional:      General: She is in acute distress.     Appearance: She is well-developed.  HENT:     Head: Normocephalic and atraumatic.     Mouth/Throat:     Mouth: Mucous membranes are dry.  Eyes:     Pupils: Pupils are equal, round, and reactive to light.  Cardiovascular:     Rate and Rhythm:  Normal rate and regular rhythm.     Pulses: Normal pulses.     Heart sounds: Normal heart sounds. No murmur heard.   No friction rub.  Pulmonary:     Effort: Pulmonary effort is normal.     Breath sounds: Normal breath sounds. No wheezing or rales.  Abdominal:     General: Bowel sounds are normal. There is no distension.     Palpations: Abdomen is soft.     Tenderness: There is abdominal tenderness in the right lower quadrant. There is right CVA tenderness and guarding. There is no rebound.  Musculoskeletal:        General: No tenderness. Normal range of motion.     Cervical back: Normal range of motion and neck supple.     Comments: No edema  Skin:    General: Skin is warm and dry.     Findings: No rash.  Neurological:     Mental Status: She is alert and oriented to person, place, and time. Mental status is at baseline.     Cranial Nerves: No cranial nerve deficit.  Psychiatric:        Behavior: Behavior normal.    ED Results / Procedures / Treatments   Labs (all labs ordered are listed, but only abnormal results are displayed) Labs Reviewed  COMPREHENSIVE METABOLIC PANEL - Abnormal; Notable for the following components:      Result Value   Glucose, Bld 139 (*)    Creatinine, Ser 1.14 (*)    AST 14 (*)    GFR, Estimated 53 (*)    All other components within normal limits  URINALYSIS, ROUTINE W REFLEX MICROSCOPIC - Abnormal; Notable for the following components:   Hgb urine dipstick LARGE (*)    Ketones, ur 15 (*)    All other components within normal limits  RESP PANEL BY RT-PCR (FLU A&B, COVID) ARPGX2  LIPASE, BLOOD  CBC  URINALYSIS, MICROSCOPIC (REFLEX)    EKG None  Radiology CT ABDOMEN PELVIS W CONTRAST  Result Date: 05/30/2021 CLINICAL DATA:  Right-sided flank pain EXAM: CT ABDOMEN AND PELVIS WITH CONTRAST TECHNIQUE: Multidetector CT imaging of the abdomen and pelvis was performed using the standard protocol following bolus administration of intravenous  contrast. CONTRAST:  142mL OMNIPAQUE IOHEXOL 300 MG/ML  SOLN COMPARISON:  CT 05/18/2007 FINDINGS: Lower chest: Lung bases demonstrate no acute consolidation or effusion. Mild cardiomegaly. Hepatobiliary: Hepatic steatosis. Gallstones. No biliary dilatation Pancreas: Unremarkable. No pancreatic ductal dilatation or surrounding inflammatory changes. Spleen: Normal in size without focal abnormality. Adrenals/Urinary Tract: Adrenal glands are normal. Small intrarenal stones within the bilateral kidneys. Mild right hydronephrosis, secondary to a 3 mm stone in the proximal right ureter just distal to  the UPJ. The bladder is decompressed. Delayed excretion on the right consistent with obstruction. Stomach/Bowel: 3 cm fat density mass within the gastric wall. Appendix appears normal. No evidence of bowel wall thickening, distention, or inflammatory changes. Vascular/Lymphatic: Moderate aortic atherosclerosis. No aneurysm. No suspicious lymph nodes Reproductive: Uterus and bilateral adnexa are unremarkable. Other: Negative for pelvic effusion or free air. Small fat containing umbilical hernia. Musculoskeletal: No acute or significant osseous findings. IMPRESSION: 1. Mild right hydronephrosis, secondary to a 3 mm stone in the proximal right ureter just distal to the UPJ. 2. Multiple intrarenal stones bilaterally 3. 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 No obstructive changes 4. Hepatic steatosis 5. Gallstones Electronically Signed   By: Donavan Foil M.D.   On: 05/30/2021 18:09    Procedures Procedures    Medications Ordered in ED Medications  ondansetron (ZOFRAN-ODT) disintegrating tablet 4 mg (4 mg Oral Given 05/30/21 1301)  morphine 4 MG/ML injection 4 mg (4 mg Intravenous Given 05/30/21 1732)  lactated ringers bolus 1,000 mL (0 mLs Intravenous Stopped 05/30/21 1900)  iohexol (OMNIPAQUE) 300 MG/ML solution 100 mL (100 mLs Intravenous Contrast Given 05/30/21 1746)  HYDROmorphone (DILAUDID) injection 1 mg  (1 mg Intravenous Given 05/30/21 1811)  ketorolac (TORADOL) 15 MG/ML injection 15 mg (15 mg Intravenous Given 05/30/21 1850)    ED Course/ Medical Decision Making/ A&P                           Medical Decision Making Amount and/or Complexity of Data Reviewed External Data Reviewed: notes. Labs: ordered. Decision-making details documented in ED Course. Radiology: ordered and independent interpretation performed. Decision-making details documented in ED Course.   Patient is a 66 year old female presenting today with nausea vomiting diarrhea as well as right flank and right lower quadrant pain.  Patient has not been able to hold down anything since yesterday.  She does appear mildly dehydrated on exam.  Based on patient's history and physical exam concern for possible kidney stone versus viral gastroenteritis versus appendicitis.  Lower suspicion for cholecystitis, hepatitis, diverticulitis or pancreatitis at this time.  Patient is denying urinary symptoms and lower suspicion for UTI.  Patient's external records from her urology appointments were reviewed but she normally just has issues with urinary incontinence no history of kidney stones or pyelonephritis.  Patient will be given IV pain control and fluids.  I independently interpreted and evaluated the patient's labs and her lipase is within normal limits, CMP with mild AKI today with creatinine of 1.14 from her baseline of 0.7, normal electrolytes, LFTs and bilirubin.  CBC within normal limits and patient's flu and COVID is negative.   8:17 PM I independently reviewed and interpreted patient's CT it appears that she has a renal stone on the right side with hydronephrosis.  Based on interpretation of radiology read they report a 3 mm stone with hydronephrosis on the right side.  Gallstones and multiple bilateral renal stones.  On repeat evaluation patient is still having severe pain.  She was given a milligram of Dilaudid and Toradol.  Still waiting  on a UA.  8:17 PM The UA is neg.  Pt is feeling better.  Findings of the testing were discussed with the patient and her questions were answered.  She has a outpatient neurologist she can follow-up with if symptoms do not resolve.        Final Clinical Impression(s) / ED Diagnoses Final diagnoses:  Kidney stone  Rx / DC Orders ED Discharge Orders          Ordered    oxyCODONE-acetaminophen (PERCOCET/ROXICET) 5-325 MG tablet  Every 6 hours PRN        05/30/21 2013    ondansetron (ZOFRAN) 4 MG tablet  Every 6 hours        05/30/21 2013              Blanchie Dessert, MD 05/30/21 2018

## 2021-05-30 NOTE — ED Provider Triage Note (Signed)
Emergency Medicine Provider Triage Evaluation Note  [facility] , a 66 y.o. female  was evaluated in triage.  Pt complains of right-sided abdominal pain starting yesterday.  Patient states that she had acute onset of persistent diarrhea yesterday.  This is progressed to consistent nausea and vomiting today.  She has vomited more times than she can count, this happens every time that she drinks or eats anything.  No history of abdominal surgeries.  Review of Systems  Positive: Abdominal pain, nausea, vomiting, diarrhea, flank pain Negative: Fever, chills, chest pain, shortness of breath, dysuria  Physical Exam  BP (!) 158/69 (BP Location: Left Wrist)    Pulse 67    Temp 98.5 F (36.9 C) (Oral)    Resp 17    SpO2 98%  Gen:   Awake, no distress   Resp:  Normal effort  MSK:   Moves extremities without difficulty  Other:    Medical Decision Making  Medically screening exam initiated at 12:58 PM.  Appropriate orders placed.  Kyla Ogata was informed that the remainder of the evaluation will be completed by another provider, this initial triage assessment does not replace that evaluation, and the importance of remaining in the ED until their evaluation is complete.     Charma Mocarski T, PA-C 05/30/21 1259

## 2021-05-30 NOTE — Discharge Instructions (Addendum)
Take the pain medicine as needed.  It may take 2 to 3 days for the stone to pass.  If you start having severe pain vomiting again even with the medication or you start running a fever you should return to the emergency room.

## 2021-06-07 ENCOUNTER — Other Ambulatory Visit: Payer: Self-pay

## 2021-06-07 ENCOUNTER — Ambulatory Visit (INDEPENDENT_AMBULATORY_CARE_PROVIDER_SITE_OTHER): Payer: Self-pay | Admitting: Urology

## 2021-06-07 ENCOUNTER — Encounter: Payer: Self-pay | Admitting: Urology

## 2021-06-07 VITALS — BP 193/111 | HR 60 | Ht 61.0 in | Wt 262.0 lb

## 2021-06-07 DIAGNOSIS — N3946 Mixed incontinence: Secondary | ICD-10-CM

## 2021-06-07 DIAGNOSIS — N200 Calculus of kidney: Secondary | ICD-10-CM

## 2021-06-07 LAB — URINALYSIS, COMPLETE
Bilirubin, UA: NEGATIVE
Glucose, UA: NEGATIVE
Ketones, UA: NEGATIVE
Nitrite, UA: NEGATIVE
Protein,UA: NEGATIVE
Specific Gravity, UA: 1.015 (ref 1.005–1.030)
Urobilinogen, Ur: 0.2 mg/dL (ref 0.2–1.0)
pH, UA: 7 (ref 5.0–7.5)

## 2021-06-07 LAB — MICROSCOPIC EXAMINATION: Bacteria, UA: NONE SEEN

## 2021-06-07 MED ORDER — OXYBUTYNIN CHLORIDE ER 10 MG PO TB24: 10.0000 mg | ORAL_TABLET | Freq: Every day | ORAL | 3 refills | Status: DC

## 2021-06-07 MED ORDER — TAMSULOSIN HCL 0.4 MG PO CAPS: 0.4000 mg | ORAL_CAPSULE | Freq: Every day | ORAL | 0 refills | Status: DC

## 2021-06-07 NOTE — Progress Notes (Signed)
06/07/2021 9:56 AM   Angelica Hess [birth year] [id number]  Referring provider: Idelle Crouch, MD [facility] [place],  [place] [address]  Chief Complaint  Patient presents with   Nephrolithiasis    HPI: I was consulted to assess the patient's 80-month history of urinary incontinence.  She leaks with coughing sneezing laughing but not bending lifting.  She has urge incontinence with little warning.  Both are significant.  I do not think she has bedwetting.  She wears 6-10 pads a day moderately wet or soaked.  She reported a distant history of blood in the urine   She voids every 1 or 2 hours.  In dental office sometimes she has to void every 20 minutes due to urgency.  She cannot hold it for 2 hours.   Exam a bit limited due to obesity.  Well supported bladder neck and negative cough test   Patient has high-volume mixed incontinence.  Role of cystoscopy and urodynamics discussed.  No blood in urine today.  She smoked many years ago.  Role of CT scan discussed.  Patient currently does not have insurance.  We will get a try her on oxybutynin ER 10 mg with 30 tabs 11 refills I will reassess her in 6 weeks.  We will call her and tell her how much it costs for cystoscopy and CT scan and she might be willing to pay but we want to get the numbers first.  I held off on urodynamics because of cost and hopefully she will get insurance in the future.   No CT scan performed.  Urine culture negative Much less urgency and can hold urination but still pad count similar.  Clinically not infected   Cystoscopy: Normal   Patient still does not have insurance.  Reassess in 6 weeks on oxybutynin ER 10 mg in combination with Myrbetriq 50 mg samples.  Taken into account insurance status   Patient is dramatically better by 80 to 90% on combination treatment.  The samples ran out she cannot afford them due to no insurance.  She still on oxybutynin with some improvement.     Urge  incontinence dramatically better. I gave 3 months of Myrbetriq samples.  She will come in every 3 months and picks them up.  I called in oxybutynin 30x11.    Today When patient takes Myrbetriq every 2 days with the oxybutynin she does very well.  She went to emergency room last week with right flank pain and nausea and vomiting.  No more nausea and vomiting.  No fever.  No burning.  No blood.  Intermittent discomfort  Nontoxic.  Minimal to no CVA tenderness  Reviewed medical record from May 30, 2021.  She was having severe pain.  Was given Dilaudid and Toradol.  No urine culture sent.  Patient had a 3 mm stone in the proximal right ureter just distal to the ureteropelvic junction.  She had mild right-sided hydronephrosis.     PMH: Past Medical History:  Diagnosis Date   Fibromyalgia    Genital herpes    Hypertension    Hypothyroidism    Sleep apnea     Surgical History: Past Surgical History:  Procedure Laterality Date   CESAREAN SECTION     TEMPOROMANDIBULAR JOINT SURGERY      Home Medications:  Allergies as of 06/07/2021       Reactions   Penicillins Itching   Sulfa Antibiotics    Allergy as a child  Medication List        Accurate as of June 07, 2021  9:56 AM. If you have any questions, ask your nurse or doctor.          fexofenadine 180 MG tablet Commonly known as: ALLEGRA Take 1 tablet (180 mg total) by mouth daily.   hydrochlorothiazide 25 MG tablet Commonly known as: HYDRODIURIL Take 1 tablet by mouth daily.   ibuprofen 200 MG tablet Commonly known as: ADVIL Take 200 mg by mouth as needed.   levothyroxine 50 MCG tablet Commonly known as: SYNTHROID Take 1 tablet (50 mcg total) by mouth daily.   levothyroxine 50 MCG tablet Commonly known as: SYNTHROID Take by mouth.   metoprolol succinate 25 MG 24 hr tablet Commonly known as: TOPROL-XL Take 25 mg by mouth daily.   metoprolol tartrate 25 MG tablet Commonly known as:  LOPRESSOR Take 25 mg by mouth 2 (two) times daily.   Myrbetriq 50 MG Tb24 tablet Generic drug: mirabegron ER Take 50 mg by mouth every other day.   olmesartan 5 MG tablet Commonly known as: BENICAR Take 1 tablet (5 mg total) by mouth daily.   olmesartan 40 MG tablet Commonly known as: BENICAR Take 40 mg by mouth daily.   ondansetron 4 MG tablet Commonly known as: ZOFRAN Take 1 tablet (4 mg total) by mouth every 6 (six) hours.   oxybutynin 10 MG 24 hr tablet Commonly known as: DITROPAN-XL Take 1 tablet (10 mg total) by mouth daily.   oxybutynin 10 MG 24 hr tablet Commonly known as: DITROPAN-XL Take 1 tablet (10 mg total) by mouth daily.   oxyCODONE-acetaminophen 5-325 MG tablet Commonly known as: PERCOCET/ROXICET Take 1 tablet by mouth every 6 (six) hours as needed for severe pain.   topiramate 50 MG tablet Commonly known as: TOPAMAX Take 50 mg by mouth 2 (two) times daily.   valACYclovir 500 MG tablet Commonly known as: VALTREX Take 1 tablet by mouth 2 (two) times daily.        Allergies:  Allergies  Allergen Reactions   Penicillins Itching   Sulfa Antibiotics     Allergy as a child    Family History: Family History  Problem Relation Age of Onset   Alzheimer's disease Mother    Breast cancer Mother 63    Social History:  reports that she has quit smoking. She has never used smokeless tobacco. She reports that she does not drink alcohol and does not use drugs.  ROS:                                        Physical Exam: There were no vitals taken for this visit.  Constitutional:  Alert and oriented, No acute distress. HEENT: [place] AT, moist mucus membranes.  Trachea midline, no masses. Cardiovascular: No clubbing, cyanosis, or edema.  Laboratory Data: Lab Results  Component Value Date   WBC 7.4 05/30/2021   HGB 14.3 05/30/2021   HCT 44.1 05/30/2021   MCV 96.9 05/30/2021   PLT 202 05/30/2021    Lab Results  Component  Value Date   CREATININE 1.14 (H) 05/30/2021    No results found for: PSA  No results found for: TESTOSTERONE  Lab Results  Component Value Date   HGBA1C 5.6 05/14/2018    Urinalysis    Component Value Date/Time   COLORURINE YELLOW 05/30/2021 1910   APPEARANCEUR CLEAR 05/30/2021 1910  APPEARANCEUR Clear 05/06/2019 1257   LABSPEC 1.010 05/30/2021 1910   PHURINE 7.0 05/30/2021 1910   GLUCOSEU NEGATIVE 05/30/2021 1910   HGBUR LARGE (A) 05/30/2021 1910   BILIRUBINUR NEGATIVE 05/30/2021 1910   BILIRUBINUR Negative 05/06/2019 1257   KETONESUR 15 (A) 05/30/2021 1910   PROTEINUR NEGATIVE 05/30/2021 1910   UROBILINOGEN 0.2 10/25/2018 1437   UROBILINOGEN 0.2 11/02/2009 1300   NITRITE NEGATIVE 05/30/2021 1910   LEUKOCYTESUR NEGATIVE 05/30/2021 1910    Pertinent Imaging:   Assessment & Plan: Patient was drawn.  Follow-up with [facility].  Indications to go to the emergency room or call clinic discussed.  Samples of Myrbetriq given and oxybutynin renewed.  I will see in 6 months on Myrbetriq every 2 days and oxybutynin daily.  Call if urine culture positive.  Flomax 0.4 mg daily for 21 days sent.  Strainer given  1. Mixed incontinence  - Urinalysis, Complete   No follow-ups on file.  Reece Packer, MD  [facility] [address] [place], [place] [address] [phone]

## 2021-06-07 NOTE — Addendum Note (Signed)
Addended by: Evelina Bucy on: 06/07/2021 11:11 AM   Modules accepted: Orders

## 2021-06-07 NOTE — Addendum Note (Signed)
Addended by: Evelina Bucy on: 06/07/2021 10:32 AM   Modules accepted: Orders

## 2021-06-09 LAB — CULTURE, URINE COMPREHENSIVE

## 2021-06-16 ENCOUNTER — Ambulatory Visit: Payer: Self-pay | Admitting: Urology

## 2021-06-17 NOTE — Progress Notes (Signed)
06/18/2021 11:31 AM   Angelica Hess Jan 17, 1956 [id number]  Referring provider: Idelle Crouch, MD [facility] [place],  [place] [address]  Chief Complaint  Patient presents with   Nephrolithiasis   Urological history: 1. Mixed incontinence -contributing factors of age and vaginal atrophy,   2. High risk hematuria -former smoker -cysto 2020 - NED -contrast CT 05/2021 -nephrolithiasis -no report of gross heme -UA 11-30 RBC's 05/2021  3. Nephrolithiasis -CT 2023- Small intrarenal stones within the bilateral kidneys  HPI: Angelica Hess is a 66 y.o. female who presents today for follow up for a right 3 mm right UPJ stone.    KUB  3 mm radiopaque calcification located in the vicinity of the right UVJ which may represent migration of the right UPJ stone.  She has not been experiencing any flank pain.  She has not passed any fragments.  Patient denies any modifying or aggravating factors.  Patient denies any gross hematuria, dysuria or suprapubic/flank pain.  Patient denies any fevers, chills, nausea or vomiting.       PMH: Past Medical History:  Diagnosis Date   Fibromyalgia    Genital herpes    Hypertension    Hypothyroidism    Sleep apnea     Surgical History: Past Surgical History:  Procedure Laterality Date   CESAREAN SECTION     TEMPOROMANDIBULAR JOINT SURGERY      Home Medications:  Allergies as of 06/18/2021       Reactions   Penicillins Itching   Sulfa Antibiotics    Allergy as a child        Medication List        Accurate as of June 18, 2021 11:31 AM. If you have any questions, ask your nurse or doctor.          STOP taking these medications    tamsulosin 0.4 MG Caps capsule Commonly known as: FLOMAX Stopped by: Demetre Monaco, PA-C       TAKE these medications    fexofenadine 180 MG tablet Commonly known as: ALLEGRA Take 1 tablet (180 mg total) by mouth daily.   hydrochlorothiazide 25 MG  tablet Commonly known as: HYDRODIURIL Take 1 tablet by mouth daily.   ibuprofen 200 MG tablet Commonly known as: ADVIL Take 200 mg by mouth as needed.   levothyroxine 50 MCG tablet Commonly known as: SYNTHROID Take 1 tablet (50 mcg total) by mouth daily.   levothyroxine 50 MCG tablet Commonly known as: SYNTHROID Take by mouth.   metoprolol succinate 25 MG 24 hr tablet Commonly known as: TOPROL-XL Take 25 mg by mouth daily.   metoprolol tartrate 25 MG tablet Commonly known as: LOPRESSOR Take 25 mg by mouth 2 (two) times daily.   mirabegron ER 50 MG Tb24 tablet Commonly known as: MYRBETRIQ Take 50 mg by mouth every other day.   olmesartan 5 MG tablet Commonly known as: BENICAR Take 1 tablet (5 mg total) by mouth daily.   olmesartan 40 MG tablet Commonly known as: BENICAR Take 40 mg by mouth daily.   ondansetron 4 MG tablet Commonly known as: ZOFRAN Take 1 tablet (4 mg total) by mouth every 6 (six) hours.   oxybutynin 10 MG 24 hr tablet Commonly known as: DITROPAN-XL Take 1 tablet (10 mg total) by mouth daily.   oxyCODONE-acetaminophen 5-325 MG tablet Commonly known as: PERCOCET/ROXICET Take 1 tablet by mouth every 6 (six) hours as needed for severe pain.   topiramate 50 MG tablet  Commonly known as: TOPAMAX Take 50 mg by mouth 2 (two) times daily.   valACYclovir 500 MG tablet Commonly known as: VALTREX Take 1 tablet by mouth 2 (two) times daily.   Vitamin D (Ergocalciferol) 1.25 MG (50000 UNIT) Caps capsule Commonly known as: DRISDOL Take 50,000 Units by mouth once a week.        Allergies:  Allergies  Allergen Reactions   Penicillins Itching   Sulfa Antibiotics     Allergy as a child    Family History: Family History  Problem Relation Age of Onset   Alzheimer's disease Mother    Breast cancer Mother 81    Social History:  reports that she has quit smoking. She has never used smokeless tobacco. She reports that she does not drink alcohol and  does not use drugs.  ROS: Pertinent ROS in HPI  Physical Exam: BP (!) 192/84    Pulse 64    Ht 5' 1"  (1.549 m)    Wt 262 lb (118.8 kg)    BMI 49.50 kg/m   Constitutional:  Well nourished. Alert and oriented, No acute distress. HEENT: [place] AT, mask in place.  Trachea midline Cardiovascular: No clubbing, cyanosis, or edema. Respiratory: Normal respiratory effort, no increased work of breathing. Neurologic: Grossly intact, no focal deficits, moving all 4 extremities. Psychiatric: Normal mood and affect.    Laboratory Data: Lab Results  Component Value Date   WBC 7.4 05/30/2021   HGB 14.3 05/30/2021   HCT 44.1 05/30/2021   MCV 96.9 05/30/2021   PLT 202 05/30/2021    Lab Results  Component Value Date   CREATININE 1.14 (H) 05/30/2021    Lab Results  Component Value Date   AST 14 (L) 05/30/2021   Lab Results  Component Value Date   ALT 17 05/30/2021    Urinalysis    Component Value Date/Time   COLORURINE YELLOW 05/30/2021 1910   APPEARANCEUR Hazy (A) 06/07/2021 1027   LABSPEC 1.010 05/30/2021 1910   PHURINE 7.0 05/30/2021 1910   GLUCOSEU Negative 06/07/2021 1027   HGBUR LARGE (A) 05/30/2021 1910   BILIRUBINUR Negative 06/07/2021 1027   KETONESUR 15 (A) 05/30/2021 1910   PROTEINUR Negative 06/07/2021 1027   PROTEINUR NEGATIVE 05/30/2021 1910   UROBILINOGEN 0.2 10/25/2018 1437   UROBILINOGEN 0.2 11/02/2009 1300   NITRITE Negative 06/07/2021 1027   NITRITE NEGATIVE 05/30/2021 1910   LEUKOCYTESUR Trace (A) 06/07/2021 1027   LEUKOCYTESUR NEGATIVE 05/30/2021 1910  I have reviewed the labs.   Pertinent Imaging: CLINICAL DATA:  Nephrolithiasis follow-up.   EXAM: ABDOMEN - 1 VIEW   COMPARISON:  CT with contrast 05/30/2021.   FINDINGS: The bowel gas pattern is normal. Previously there was a 3 mm right UPJ stone superimposing at the level of the inferior border of the distal right L2 transverse process.   A stone is no longer seen at this level. Slightly  irregular 4 mm calculus or phlebolith now seen in the right hemipelvis at about the mid acetabular level but there are also multiple pelvic phleboliths and is difficult to tell if this is a phlebolith or distal ureteral stone.   On the left, there is an irregular stone again noted in the upper pole collecting system measuring 6 x 3 mm and a linear stone inferior to this measuring 4 mm length.   Scattered punctate stones were noted in both renal collecting system but are not visible radiographically.   Two lucent centered gallstones again project in the right mid abdomen, largest  of these is 1.8 cm.   Visceral shadows are stable. There is degenerative change of the spine.   IMPRESSION: 1. 3 mm UPJ stone previously at the level of the distal right L2 transverse process is not seen today. 2. 3 x 4 mm calcification at the mid acetabular level in the right hemipelvis could represent a phlebolith or distal ureteral stone. There are phleboliths at this level on CT on both sides. 3. Left nephrolithiasis again is visible but the bilateral scattered punctate nonobstructing caliceal stones noted on CT are not radiographically apparent. 4. Cholelithiasis.     Electronically Signed   By: Telford Nab M.D.   On: 06/19/2021 21:03 I have independently reviewed the films.    Assessment & Plan:    1. Right UPJ stone -has possibly migrated to her right UVJ on today's KUB -continue MET  2. Microscopic hematuria -will continue to monitor to ensure micro heme clears with the passage of the stone  3.  Mixed incontinence -managed with oxybutynin XL 10 mg daily    Return in about 1 week (around 06/25/2021) for KUB and office visit .  These notes generated with voice recognition software. I apologize for typographical errors.  Zara Council, PA-C  [facility] [address]  [place], [place] [address] [phone]

## 2021-06-18 ENCOUNTER — Ambulatory Visit (INDEPENDENT_AMBULATORY_CARE_PROVIDER_SITE_OTHER): Payer: Self-pay | Admitting: Urology

## 2021-06-18 ENCOUNTER — Ambulatory Visit
Admission: RE | Admit: 2021-06-18 | Discharge: 2021-06-18 | Disposition: A | Discharge status: Home / Self Care | Payer: Self-pay | Source: Ambulatory Visit | Attending: Urology | Admitting: Urology

## 2021-06-18 ENCOUNTER — Other Ambulatory Visit: Payer: Self-pay

## 2021-06-18 ENCOUNTER — Encounter: Payer: Self-pay | Admitting: Urology

## 2021-06-18 VITALS — BP 192/84 | HR 64 | Ht 61.0 in | Wt 262.0 lb

## 2021-06-18 DIAGNOSIS — N200 Calculus of kidney: Secondary | ICD-10-CM

## 2021-06-18 DIAGNOSIS — N3946 Mixed incontinence: Secondary | ICD-10-CM

## 2021-06-18 DIAGNOSIS — R3129 Other microscopic hematuria: Secondary | ICD-10-CM

## 2021-06-24 NOTE — Progress Notes (Signed)
06/25/21 11:35 AM   Angelica Hess [birth year] [id number]  Referring provider:  Idelle Crouch, MD [facility] [place],  [place] [address]  Chief Complaint  Patient presents with   Nephrolithiasis    Urological history  1. Mixed incontinence - Managed on Oxybutynin XL 10 mg daily  -contributing factors of age and vaginal atrophy,    2. High risk hematuria -former smoker -cysto 2020 - NED -contrast CT 05/2021 -nephrolithiasis -UA 11-30 RBC's 05/2021  3. Nephrolithiasis -CT 2023- Small intrarenal stones within the bilateral kidneys - KUB 2023  3 mm radiopaque calcification located in the vicinity of the right UVJ which may represent migration of the right UPJ stone.      HPI: Angelica Hess is a 66 y.o.female who returns today for 1 week follow-up KUB.   KUB visualizes no obvious right UVJ stone.   She has not passed any fragments. Patient denies any modifying or aggravating factors.  Patient denies any gross hematuria, dysuria or suprapubic/flank pain.  Patient denies any fevers, chills, nausea or vomiting.     PMH: Past Medical History:  Diagnosis Date   Fibromyalgia    Genital herpes    Hypertension    Hypothyroidism    Sleep apnea     Surgical History: Past Surgical History:  Procedure Laterality Date   CESAREAN SECTION     TEMPOROMANDIBULAR JOINT SURGERY      Home Medications:  Allergies as of 06/25/2021       Reactions   Penicillins Itching   Sulfa Antibiotics    Allergy as a child        Medication List        Accurate as of June 25, 2021 11:35 AM. If you have any questions, ask your nurse or doctor.          fexofenadine 180 MG tablet Commonly known as: ALLEGRA Take 1 tablet (180 mg total) by mouth daily.   hydrochlorothiazide 25 MG tablet Commonly known as: HYDRODIURIL Take 1 tablet by mouth daily.   ibuprofen 200 MG tablet Commonly known as: ADVIL Take 200 mg by mouth as needed.   levothyroxine 50  MCG tablet Commonly known as: SYNTHROID Take 1 tablet (50 mcg total) by mouth daily.   levothyroxine 50 MCG tablet Commonly known as: SYNTHROID Take by mouth.   metoprolol succinate 25 MG 24 hr tablet Commonly known as: TOPROL-XL Take 25 mg by mouth daily.   metoprolol tartrate 25 MG tablet Commonly known as: LOPRESSOR Take 25 mg by mouth 2 (two) times daily.   mirabegron ER 50 MG Tb24 tablet Commonly known as: MYRBETRIQ Take 50 mg by mouth every other day.   olmesartan 5 MG tablet Commonly known as: BENICAR Take 1 tablet (5 mg total) by mouth daily.   olmesartan 40 MG tablet Commonly known as: BENICAR Take 40 mg by mouth daily.   ondansetron 4 MG tablet Commonly known as: ZOFRAN Take 1 tablet (4 mg total) by mouth every 6 (six) hours.   oxybutynin 10 MG 24 hr tablet Commonly known as: DITROPAN-XL Take 1 tablet (10 mg total) by mouth daily.   oxyCODONE-acetaminophen 5-325 MG tablet Commonly known as: PERCOCET/ROXICET Take 1 tablet by mouth every 6 (six) hours as needed for severe pain.   topiramate 50 MG tablet Commonly known as: TOPAMAX Take 50 mg by mouth 2 (two) times daily.   valACYclovir 500 MG tablet Commonly known as: VALTREX Take 1 tablet by mouth 2 (two) times daily.  Vitamin D (Ergocalciferol) 1.25 MG (50000 UNIT) Caps capsule Commonly known as: DRISDOL Take 50,000 Units by mouth once a week.        Allergies:  Allergies  Allergen Reactions   Penicillins Itching   Sulfa Antibiotics     Allergy as a child    Family History: Family History  Problem Relation Age of Onset   Alzheimer's disease Mother    Breast cancer Mother 26    Social History:  reports that she has quit smoking. She has never used smokeless tobacco. She reports that she does not drink alcohol and does not use drugs.   Physical Exam: BP (!) 142/88    Pulse 96    Ht 5\' 1"  (1.549 m)    Wt 262 lb (118.8 kg)    BMI 49.50 kg/m   Constitutional:  Alert and oriented, No  acute distress. HEENT: [place] AT, moist mucus membranes.  Trachea midline, no masses. Cardiovascular: No clubbing, cyanosis, or edema. Respiratory: Normal respiratory effort, no increased work of breathing. Neurologic: Grossly intact, no focal deficits, moving all 4 extremities. Psychiatric: Normal mood and affect.  Laboratory Data:  Lab Results  Component Value Date   CREATININE 1.14 (H) 05/30/2021    Component     Latest Ref Rng & Units 05/30/2021  Sodium     135 - 145 mmol/L 141  Potassium     3.5 - 5.1 mmol/L 3.9  Chloride     98 - 111 mmol/L 105  CO2     22 - 32 mmol/L 25  Glucose     70 - 99 mg/dL 139 (H)  BUN     8 - 23 mg/dL 20  Creatinine     0.44 - 1.00 mg/dL 1.14 (H)  Calcium     8.9 - 10.3 mg/dL 10.1  Total Protein     6.5 - 8.1 g/dL 6.6  Albumin     3.5 - 5.0 g/dL 3.5  AST     15 - 41 U/L 14 (L)  ALT     0 - 44 U/L 17  Alkaline Phosphatase     38 - 126 U/L 95  Total Bilirubin     0.3 - 1.2 mg/dL 0.6  GFR, Estimated     >60 mL/min 53 (L)  Anion gap     5 - 15 11   Component     Latest Ref Rng & Units 05/30/2021 06/07/2021  WBC     4.0 - 10.5 K/uL 7.4   RBC     0 - 2 /hpf 4.55 11-30 (A)  Hemoglobin     12.0 - 15.0 g/dL 14.3   HCT     36.0 - 46.0 % 44.1   MCV     80.0 - 100.0 fL 96.9   MCH     26.0 - 34.0 pg 31.4   MCHC     30.0 - 36.0 g/dL 32.4   RDW     11.5 - 15.5 % 13.2   Platelets     150 - 400 K/uL 202   nRBC     0.0 - 0.2 % 0.0   WBC, UA     0 - 5 /hpf  6-10 (A)  Epithelial Cells (non renal)     0 - 10 /hpf  0-10  Renal Epithel, UA     None seen /hpf    Mucus, UA     Not Estab.  Present (A)  Bacteria, UA  None seen/Few  None seen   Pertinent Imaging: CLINICAL DATA:  Right renal stone   EXAM: ABDOMEN - 1 VIEW   COMPARISON:  06/18/2021 from prior CT., 05/30/2021   FINDINGS: Cholelithiasis is again identified. Scattered nonobstructing left renal stones are noted stable in appearance from the prior exam. No discrete  right renal calculus is seen. Previously seen right pelvic calcification is less well visualized than on the prior exam. Two small phleboliths are present stable in appearance   IMPRESSION: Nonobstructing left renal stone stable from prior exam.   No definitive right ureteral stone is noted.   Cholelithiasis     Electronically Signed   By: Inez Catalina M.D.   On: 06/27/2021 09:50  I have independently reviewed the films.  See HPI.     Assessment & Plan:   1. Right UPJ stone - Not appreciated on KUB today - She has not had interval passage of the stone  - Will plan for repeat KUB.  - Encourage her to continue drinking water and light jogging to promote interval passage of the stone.   Return in 1 week for KUB and [facility] [address], [place], [place] [address] [phone]  I,Kailey Littlejohn,acting as a scribe for [facility], PA-C.,have documented all relevant documentation on the behalf of Angelica Reiger, PA-C,as directed by  [facility], PA-C while in the presence of [place], PA-C.

## 2021-06-25 ENCOUNTER — Other Ambulatory Visit: Payer: Self-pay

## 2021-06-25 ENCOUNTER — Ambulatory Visit
Admission: RE | Admit: 2021-06-25 | Discharge: 2021-06-25 | Disposition: A | Discharge status: Home / Self Care | Payer: Self-pay | Source: Ambulatory Visit | Attending: Urology | Admitting: Urology

## 2021-06-25 ENCOUNTER — Encounter: Payer: Self-pay | Admitting: Urology

## 2021-06-25 ENCOUNTER — Ambulatory Visit (INDEPENDENT_AMBULATORY_CARE_PROVIDER_SITE_OTHER): Payer: Self-pay | Admitting: Urology

## 2021-06-25 ENCOUNTER — Ambulatory Visit
Admission: RE | Admit: 2021-06-25 | Discharge: 2021-06-25 | Disposition: A | Discharge status: Home / Self Care | Payer: Self-pay | Attending: Urology | Admitting: Urology

## 2021-06-25 VITALS — BP 142/88 | HR 96 | Ht 61.0 in | Wt 262.0 lb

## 2021-06-25 DIAGNOSIS — N200 Calculus of kidney: Secondary | ICD-10-CM

## 2021-07-01 NOTE — Progress Notes (Signed)
07/11/21 9:01 PM   Angelica Hess 09-Jul-1955 [id number]  Referring provider:  Idelle Crouch, MD [facility] [place],  [place] [address]  Chief Complaint  Patient presents with   Nephrolithiasis    Urological history   1. Mixed incontinence - Managed on Oxybutynin XL 10 mg daily  -contributing factors of age and vaginal atrophy,    2. High risk hematuria -former smoker -cysto 2020 - NED -contrast CT 05/2021 -nephrolithiasis -no reports of gross heme -UA > 30 RBC's   3. Nephrolithiasis -CT 2023- Small intrarenal stones within the bilateral kidneys -KUB 07/02/2021 -small left renal stones - no ureteral stones noted   HPI: Angelica Hess is a 65 y.o.female who returns today for 1 week follow-up KUB.   KUB visualizes no obvious right UVJ stone.   UA > 30 RBC's  She has not passed any fragments.  She has had gross hematuria.  She is experiencing flank discomfort on the right.    Patient denies any modifying or aggravating factors.  Patient denies any gross hematuria, dysuria or suprapubic/flank pain.  Patient denies any fevers, chills, nausea or vomiting.    PMH: Past Medical History:  Diagnosis Date   Fibromyalgia    Genital herpes    Hypertension    Hypothyroidism    Sleep apnea     Surgical History: Past Surgical History:  Procedure Laterality Date   CESAREAN SECTION     TEMPOROMANDIBULAR JOINT SURGERY      Home Medications:  Allergies as of 07/02/2021       Reactions   Penicillins Itching   Sulfa Antibiotics    Allergy as a child        Medication List        Accurate as of July 02, 2021 11:59 PM. If you have any questions, ask your nurse or doctor.          STOP taking these medications    ondansetron 4 MG tablet Commonly known as: ZOFRAN Stopped by: Victorino Fatzinger, PA-C   oxyCODONE-acetaminophen 5-325 MG tablet Commonly known as: PERCOCET/ROXICET Stopped by: Zara Council, PA-C       TAKE these  medications    fexofenadine 180 MG tablet Commonly known as: ALLEGRA Take 1 tablet (180 mg total) by mouth daily.   hydrochlorothiazide 25 MG tablet Commonly known as: HYDRODIURIL Take 1 tablet by mouth daily.   ibuprofen 200 MG tablet Commonly known as: ADVIL Take 200 mg by mouth as needed.   levothyroxine 50 MCG tablet Commonly known as: SYNTHROID Take 1 tablet (50 mcg total) by mouth daily.   metoprolol tartrate 25 MG tablet Commonly known as: LOPRESSOR Take 25 mg by mouth 2 (two) times daily.   mirabegron ER 50 MG Tb24 tablet Commonly known as: MYRBETRIQ Take 50 mg by mouth every other day.   olmesartan 40 MG tablet Commonly known as: BENICAR Take 40 mg by mouth daily. What changed: Another medication with the same name was removed. Continue taking this medication, and follow the directions you see here. Changed by: Zara Council, PA-C   oxybutynin 10 MG 24 hr tablet Commonly known as: DITROPAN-XL Take 1 tablet (10 mg total) by mouth daily.   topiramate 50 MG tablet Commonly known as: TOPAMAX Take 50 mg by mouth 2 (two) times daily.   valACYclovir 500 MG tablet Commonly known as: VALTREX Take 1 tablet by mouth 2 (two) times daily.   Vitamin D (Ergocalciferol) 1.25 MG (50000 UNIT) Caps capsule Commonly known  as: DRISDOL Take 50,000 Units by mouth once a week.        Allergies:  Allergies  Allergen Reactions   Penicillins Itching   Sulfa Antibiotics     Allergy as a child    Family History: Family History  Problem Relation Age of Onset   Alzheimer's disease Mother    Breast cancer Mother 74    Social History:  reports that she has quit smoking. She has never used smokeless tobacco. She reports that she does not drink alcohol and does not use drugs.   Physical Exam: BP (!) 172/65    Pulse 64    Ht 5\' 1"  (1.549 m)    Wt 259 lb (117.5 kg)    BMI 48.94 kg/m   Constitutional:  Well nourished. Alert and oriented, No acute distress. HEENT: [place] AT,  mask in place.  trachea midline Cardiovascular: No clubbing, cyanosis, or edema. Respiratory: Normal respiratory effort, no increased work of breathing. Neurologic: Grossly intact, no focal deficits, moving all 4 extremities. Psychiatric: Normal mood and affect.    Laboratory Data: Component     Latest Ref Rng & Units 07/02/2021  Specific Gravity, UA     1.005 - 1.030 1.020  pH, UA     5.0 - 7.5 7.0  Color, UA     Yellow Pale  Appearance Ur     Clear Clear  Leukocytes,UA     Negative Trace (A)  Protein,UA     Negative/Trace Negative  Glucose, UA     Negative Negative  Ketones, UA     Negative Negative  RBC, UA     Negative 3+ (A)  Bilirubin, UA     Negative Negative  Urobilinogen, Ur     0.2 - 1.0 mg/dL 0.2  Nitrite, UA     Negative Negative  Microscopic Examination      See below:   Component     Latest Ref Rng & Units 07/02/2021  WBC, UA     0 - 5 /hpf 0-5  RBC     0 - 2 /hpf >30 (H)  Epithelial Cells (non renal)     0 - 10 /hpf 0-10  Bacteria, UA     None seen/Few None seen  I have reviewed the labs.    Pertinent Imaging: CLINICAL DATA:  Kidney stones.   EXAM: ABDOMEN - 1 VIEW   COMPARISON:  CT abdomen pelvis dated 05/30/2021 and abdominal radiograph dated 06/25/2021.   FINDINGS: There is moderate stool throughout the colon. No bowel dilatation or evidence of obstruction. Multiple small stones noted over the left renal silhouette measuring up to 4 mm. Evaluation of the right kidney is limited due to superimposition of stool within the colon. Several gallstones. No acute osseous pathology. The soft tissues are unremarkable.   IMPRESSION: 1. Multiple small left renal stones. 2. Cholelithiasis.     Electronically Signed   By: Anner Crete M.D.   On: 07/04/2021 00:08 I have independently reviewed the films.  See HPI.     Assessment & Plan:    1. Right UPJ stone -stone is not visible on KUB -will obtain a CT renal stone study at this  time  2. Gross hematuria -likely secondary to nephrolithiasis -CT renal stone study   Return in 1 week for KUB and [facility] [address] [place], [place] [address] [phone]

## 2021-07-02 ENCOUNTER — Ambulatory Visit
Admission: RE | Admit: 2021-07-02 | Discharge: 2021-07-02 | Disposition: A | Discharge status: Home / Self Care | Payer: Self-pay | Attending: Urology | Admitting: Urology

## 2021-07-02 ENCOUNTER — Other Ambulatory Visit: Payer: Self-pay

## 2021-07-02 ENCOUNTER — Ambulatory Visit
Admission: RE | Admit: 2021-07-02 | Discharge: 2021-07-02 | Disposition: A | Discharge status: Home / Self Care | Payer: Self-pay | Source: Ambulatory Visit | Attending: Urology | Admitting: Urology

## 2021-07-02 ENCOUNTER — Ambulatory Visit (INDEPENDENT_AMBULATORY_CARE_PROVIDER_SITE_OTHER): Payer: Self-pay | Admitting: Urology

## 2021-07-02 ENCOUNTER — Encounter: Payer: Self-pay | Admitting: Urology

## 2021-07-02 VITALS — BP 172/65 | HR 64 | Ht 61.0 in | Wt 259.0 lb

## 2021-07-02 DIAGNOSIS — N200 Calculus of kidney: Secondary | ICD-10-CM

## 2021-07-02 DIAGNOSIS — R310 Gross hematuria: Secondary | ICD-10-CM

## 2021-07-02 DIAGNOSIS — R3129 Other microscopic hematuria: Secondary | ICD-10-CM

## 2021-07-02 DIAGNOSIS — N3946 Mixed incontinence: Secondary | ICD-10-CM

## 2021-07-02 LAB — URINALYSIS, COMPLETE
Bilirubin, UA: NEGATIVE
Glucose, UA: NEGATIVE
Ketones, UA: NEGATIVE
Nitrite, UA: NEGATIVE
Protein,UA: NEGATIVE
Specific Gravity, UA: 1.02 (ref 1.005–1.030)
Urobilinogen, Ur: 0.2 mg/dL (ref 0.2–1.0)
pH, UA: 7 (ref 5.0–7.5)

## 2021-07-02 LAB — MICROSCOPIC EXAMINATION
Bacteria, UA: NONE SEEN
RBC, Urine: >30 /hpf — ABNORMAL HIGH (ref 0–2)

## 2021-07-02 NOTE — Patient Instructions (Signed)
The number to the CT scheduling department is [phone]

## 2021-07-05 LAB — CULTURE, URINE COMPREHENSIVE

## 2021-07-16 ENCOUNTER — Other Ambulatory Visit: Payer: Self-pay

## 2021-07-16 ENCOUNTER — Ambulatory Visit
Admission: RE | Admit: 2021-07-16 | Discharge: 2021-07-16 | Disposition: A | Discharge status: Home / Self Care | Payer: Self-pay | Source: Ambulatory Visit | Attending: Urology | Admitting: Urology

## 2021-07-16 DIAGNOSIS — R310 Gross hematuria: Secondary | ICD-10-CM | POA: Insufficient documentation

## 2021-09-06 ENCOUNTER — Other Ambulatory Visit: Payer: Self-pay | Admitting: Internal Medicine

## 2021-09-06 DIAGNOSIS — Z1231 Encounter for screening mammogram for malignant neoplasm of breast: Secondary | ICD-10-CM

## 2021-11-08 ENCOUNTER — Ambulatory Visit (INDEPENDENT_AMBULATORY_CARE_PROVIDER_SITE_OTHER): Payer: Self-pay | Admitting: Urology

## 2021-11-08 ENCOUNTER — Encounter: Payer: Self-pay | Admitting: Urology

## 2021-11-08 VITALS — Ht 61.0 in | Wt 261.0 lb

## 2021-11-08 DIAGNOSIS — N3946 Mixed incontinence: Secondary | ICD-10-CM

## 2021-11-08 LAB — URINALYSIS, COMPLETE
Bilirubin, UA: NEGATIVE
Glucose, UA: NEGATIVE
Ketones, UA: NEGATIVE
Leukocytes,UA: NEGATIVE
Nitrite, UA: NEGATIVE
Protein,UA: NEGATIVE
RBC, UA: NEGATIVE
Specific Gravity, UA: 1.015 (ref 1.005–1.030)
Urobilinogen, Ur: 0.2 mg/dL (ref 0.2–1.0)
pH, UA: 7.5 (ref 5.0–7.5)

## 2021-11-08 LAB — MICROSCOPIC EXAMINATION
Bacteria, UA: NONE SEEN
RBC, Urine: NONE SEEN /hpf (ref 0–2)

## 2021-11-08 MED ORDER — MIRABEGRON ER 50 MG PO TB24: 50.0000 mg | ORAL_TABLET | ORAL | 0 refills | Status: DC

## 2021-11-08 NOTE — Progress Notes (Signed)
11/08/2021 8:54 AM   Angelica Hess [birth year] [id number]  Referring provider: Idelle Crouch, MD [place] [facility] [place],  [place] [address]  Chief Complaint  Patient presents with   Follow-up   Urinary Incontinence    1 year follow-up    HPI: I was consulted to assess the patient's 61-monthhistory of urinary incontinence.  She leaks with coughing sneezing laughing but not bending lifting.  She has urge incontinence with little warning.  Both are significant.  I do not think she has bedwetting.  She wears 6-10 pads a day moderately wet or soaked.  She reported a distant history of blood in the urine   She voids every 1 or 2 hours.  In dental office sometimes she has to void every 20 minutes due to urgency.  She cannot hold it for 2 hours.   Exam a bit limited due to obesity.  Well supported bladder neck and negative cough test   Patient has high-volume mixed incontinence.  Role of cystoscopy and urodynamics discussed.  No blood in urine today.  She smoked many years ago.  Role of CT scan discussed.  Patient currently does not have insurance.  We will get a try her on oxybutynin ER 10 mg with 30 tabs 11 refills I will reassess her in 6 weeks.  We will call her and tell her how much it costs for cystoscopy and CT scan and she might be willing to pay but we want to get the numbers first.  I held off on urodynamics because of cost and hopefully she will get insurance in the future.   No CT scan performed.  Urine culture negative Much less urgency and can hold urination but still pad count similar.  Clinically not infected   Cystoscopy: Normal   Patient still does not have insurance.  Reassess in 6 weeks on oxybutynin ER 10 mg in combination with Myrbetriq 50 mg samples.  Taken into account insurance status   Patient is dramatically better by 80 to 90% on combination treatment.  The samples ran out she cannot afford them due to no insurance.  She still on  oxybutynin with some improvement.     Urge incontinence dramatically better. I gave 3 months of Myrbetriq samples.  She will come in every 3 months and picks them up.  I called in oxybutynin 30x11.     Today When I saw her last year and she saw the nurse practitioner as well she was trying to pass a 2 mm stone on the right side.  Patient has good control on oxybutynin and taking the Myrbetriq every 2 or 3 days.  She had a CT scan stone protocol July 02, 2021 and a 2 mm stone had migrated to the right ureterovesical junction.  She did not have follow-up.  She still has intermittent discomfort in the right flank and right lower quadrant but was a bit nonspecific.   PMH: Past Medical History:  Diagnosis Date   Fibromyalgia    Genital herpes    Hypertension    Hypothyroidism    Sleep apnea     Surgical History: Past Surgical History:  Procedure Laterality Date   CESAREAN SECTION     TEMPOROMANDIBULAR JOINT SURGERY      Home Medications:  Allergies as of 11/08/2021       Reactions   Penicillins Itching   Sulfa Antibiotics    Allergy as a child  Medication List        Accurate as of November 08, 2021  8:54 AM. If you have any questions, ask your nurse or doctor.          fexofenadine 180 MG tablet Commonly known as: ALLEGRA Take 1 tablet (180 mg total) by mouth daily.   hydrochlorothiazide 25 MG tablet Commonly known as: HYDRODIURIL Take 1 tablet by mouth daily.   ibuprofen 200 MG tablet Commonly known as: ADVIL Take 200 mg by mouth as needed.   levothyroxine 50 MCG tablet Commonly known as: SYNTHROID Take 1 tablet (50 mcg total) by mouth daily.   metoprolol tartrate 25 MG tablet Commonly known as: LOPRESSOR Take 25 mg by mouth 2 (two) times daily.   mirabegron ER 50 MG Tb24 tablet Commonly known as: MYRBETRIQ Take 50 mg by mouth every other day.   olmesartan 40 MG tablet Commonly known as: BENICAR Take 40 mg by mouth daily.   oxybutynin 10  MG 24 hr tablet Commonly known as: DITROPAN-XL Take 1 tablet (10 mg total) by mouth daily.   topiramate 50 MG tablet Commonly known as: TOPAMAX Take 50 mg by mouth 2 (two) times daily.   valACYclovir 500 MG tablet Commonly known as: VALTREX Take 1 tablet by mouth 2 (two) times daily.   Vitamin D (Ergocalciferol) 1.25 MG (50000 UNIT) Caps capsule Commonly known as: DRISDOL Take 50,000 Units by mouth once a week.        Allergies:  Allergies  Allergen Reactions   Penicillins Itching   Sulfa Antibiotics     Allergy as a child    Family History: Family History  Problem Relation Age of Onset   Alzheimer's disease Mother    Breast cancer Mother 28    Social History:  reports that she has quit smoking. She has never used smokeless tobacco. She reports that she does not drink alcohol and does not use drugs.  ROS:                                        Physical Exam:   Laboratory Data: Lab Results  Component Value Date   WBC 7.4 05/30/2021   HGB 14.3 05/30/2021   HCT 44.1 05/30/2021   MCV 96.9 05/30/2021   PLT 202 05/30/2021    Lab Results  Component Value Date   CREATININE 1.14 (H) 05/30/2021    No results found for: "PSA"  No results found for: "TESTOSTERONE"  Lab Results  Component Value Date   HGBA1C 5.6 05/14/2018    Urinalysis    Component Value Date/Time   COLORURINE YELLOW 05/30/2021 1910   APPEARANCEUR Clear 07/02/2021 0825   LABSPEC 1.010 05/30/2021 1910   PHURINE 7.0 05/30/2021 1910   GLUCOSEU Negative 07/02/2021 0825   HGBUR LARGE (A) 05/30/2021 1910   BILIRUBINUR Negative 07/02/2021 0825   KETONESUR 15 (A) 05/30/2021 1910   PROTEINUR Negative 07/02/2021 0825   PROTEINUR NEGATIVE 05/30/2021 1910   UROBILINOGEN 0.2 10/25/2018 1437   UROBILINOGEN 0.2 11/02/2009 1300   NITRITE Negative 07/02/2021 0825   NITRITE NEGATIVE 05/30/2021 1910   LEUKOCYTESUR Trace (A) 07/02/2021 0825   LEUKOCYTESUR NEGATIVE  05/30/2021 1910    Pertinent Imaging:   Assessment & Plan: The patient had had the stone for a number of months before the CT scan.  I thought it was best to rescan to make certain she has passed the stone.  I will have her see our nurse practitioner at the CT stone protocol.  If she still has a stone she may need to see one of my partners for ureteroscopy due to its chronic nature.  More Myrbetriq samples given but at 25 mg due to availability of samples.  Reassess on oxybutynin and Myrbetriq in 6 months.  Call if culture positive.  Importantly she did not have microscopic hematuria today  There are no diagnoses linked to this encounter.  No follow-ups on file.  Reece Packer, MD  [place] [address] [place], [place] [address] [phone]

## 2021-11-11 LAB — CULTURE, URINE COMPREHENSIVE

## 2021-12-06 ENCOUNTER — Ambulatory Visit: Payer: Self-pay | Admitting: Urology

## 2021-12-09 ENCOUNTER — Ambulatory Visit: Payer: No Typology Code available for payment source | Admitting: Urology

## 2021-12-31 ENCOUNTER — Ambulatory Visit
Admission: RE | Admit: 2021-12-31 | Discharge: 2021-12-31 | Disposition: A | Discharge status: Home / Self Care | Payer: Self-pay | Source: Ambulatory Visit | Attending: Urology | Admitting: Urology

## 2021-12-31 DIAGNOSIS — N3946 Mixed incontinence: Secondary | ICD-10-CM | POA: Insufficient documentation

## 2021-12-31 LAB — POCT I-STAT CREATININE: Creatinine, Ser: 0.9 mg/dL (ref 0.44–1.00)

## 2021-12-31 MED ORDER — IOHEXOL 300 MG/ML  SOLN
125.0000 mL | Freq: Once | INTRAMUSCULAR | Status: AC | PRN
  Administered 2021-12-31: 125 mL via INTRAVENOUS

## 2022-01-07 ENCOUNTER — Ambulatory Visit (INDEPENDENT_AMBULATORY_CARE_PROVIDER_SITE_OTHER): Payer: Self-pay | Admitting: Urology

## 2022-01-07 ENCOUNTER — Encounter: Payer: Self-pay | Admitting: Urology

## 2022-01-07 VITALS — BP 156/92 | HR 58 | Ht 60.0 in | Wt 262.0 lb

## 2022-01-07 DIAGNOSIS — N200 Calculus of kidney: Secondary | ICD-10-CM

## 2022-01-07 DIAGNOSIS — N3946 Mixed incontinence: Secondary | ICD-10-CM

## 2022-01-07 NOTE — Progress Notes (Signed)
01/07/22 9:37 AM   Angelica Hess 1955/06/07 [id number]  Referring provider:  Idelle Crouch, MD [place] [facility] [place],  [place] [address]  Urological history    1. Mixed incontinence - Managed on Oxybutynin XL 10 mg daily and myrbetriq  -contributing factors of age and vaginal atrophy,    2. High risk hematuria -former smoker -cysto 2020 - NED -contrast CT 05/2021 -nephrolithiasis -no reports of gross heme   3. Nephrolithiasis -CT 2023- Small intrarenal stones within the bilateral kidneys -KUB 07/02/2021 -small left renal stones - no ureteral stones noted   Chief Complaint  Patient presents with   Nephrolithiasis    HPI: Angelica Hess is a 66 y.o.female she presents today for CT results.   She underwent a CT urogram on 12/31/2021 for further evaluation of right flank pain radiating into the groin intermittently for 7 months, intermittent gross hematuria, and history of kidney stones. Pre contrast images demonstrate several left greater than right renal calculi bilaterally, largest in the upper pole of the left kidney measuring 6 mm. No residual ureteral or bladder calculi are seen. Post-contrast, both kidneys enhance normally. There is no evidence of enhancing renal mass. There is no hydronephrosis or perinephric soft tissue stranding.  She reports that she has been taking oxybutynin and myrbetriq. She takes oxybutynin daily.    PMH: Past Medical History:  Diagnosis Date   Fibromyalgia    Genital herpes    Hypertension    Hypothyroidism    Sleep apnea     Surgical History: Past Surgical History:  Procedure Laterality Date   CESAREAN SECTION     TEMPOROMANDIBULAR JOINT SURGERY      Home Medications:  Allergies as of 01/07/2022       Reactions   Penicillins Itching   Sulfa Antibiotics    Allergy as a child        Medication List        Accurate as of January 07, 2022  9:37 AM. If you have any questions, ask your nurse or doctor.           fexofenadine 180 MG tablet Commonly known as: ALLEGRA Take 1 tablet (180 mg total) by mouth daily.   hydrochlorothiazide 25 MG tablet Commonly known as: HYDRODIURIL Take 1 tablet by mouth daily.   ibuprofen 200 MG tablet Commonly known as: ADVIL Take 200 mg by mouth as needed.   levothyroxine 50 MCG tablet Commonly known as: SYNTHROID Take 1 tablet (50 mcg total) by mouth daily.   metoprolol tartrate 25 MG tablet Commonly known as: LOPRESSOR Take 25 mg by mouth 2 (two) times daily.   mirabegron ER 50 MG Tb24 tablet Commonly known as: MYRBETRIQ Take 1 tablet (50 mg total) by mouth every other day. [id number] Exp 08/2023   olmesartan 40 MG tablet Commonly known as: BENICAR Take 40 mg by mouth daily.   oxybutynin 10 MG 24 hr tablet Commonly known as: DITROPAN-XL Take 1 tablet (10 mg total) by mouth daily.   topiramate 50 MG tablet Commonly known as: TOPAMAX Take 50 mg by mouth 2 (two) times daily.   valACYclovir 500 MG tablet Commonly known as: VALTREX Take 1 tablet by mouth 2 (two) times daily.   Vitamin D (Ergocalciferol) 1.25 MG (50000 UNIT) Caps capsule Commonly known as: DRISDOL Take 50,000 Units by mouth once a week.        Allergies:  Allergies  Allergen Reactions   Penicillins Itching   Sulfa Antibiotics  Allergy as a child    Family History: Family History  Problem Relation Age of Onset   Alzheimer's disease Mother    Breast cancer Mother 44    Social History:  reports that she has quit smoking. She has been exposed to tobacco smoke. She has never used smokeless tobacco. She reports that she does not drink alcohol and does not use drugs.   Physical Exam: BP (!) 156/92   Pulse (!) 58   Ht 5' (1.524 m)   Wt 262 lb (118.8 kg)   BMI 51.17 kg/m   Constitutional:  Alert and oriented, No acute distress. HEENT: [place] AT, moist mucus membranes.  Trachea midline Cardiovascular: No clubbing, cyanosis, or edema. Respiratory: Normal  respiratory effort, no increased work of breathing. Neurologic: Grossly intact, no focal deficits, moving all 4 extremities. Psychiatric: Normal mood and affect.  Laboratory Data: Lab Results  Component Value Date   CREATININE 0.90 12/31/2021     Ref Range & Units 3 mo ago  Hemoglobin A1C 4.2 - 5.6 % 5.8 High    Average Blood Glucose (Calc) mg/dL 120   Resulting [facility] - LAB  Narrative Performed by [facility] - LAB Normal Range:    4.2 - 5.6%  Increased Risk:  5.7 - 6.4%  Diabetes:        >= 6.5%  Glycemic Control for adults with diabetes:  <7%    Specimen Collected: 09/17/21 08:04 Last Resulted: 09/17/21 10:22  Received From: [facility]  Result Received: 09/22/21 13:14   WBC (White Blood Cell Count) 4.1 - 10.2 10^3/uL 5.3   RBC (Red Blood Cell Count) 4.04 - 5.48 10^6/uL 4.27   Hemoglobin 12.0 - 15.0 gm/dL 13.1   Hematocrit 35.0 - 47.0 % 40.9   MCV (Mean Corpuscular Volume) 80.0 - 100.0 fl 95.8   MCH (Mean Corpuscular Hemoglobin) 27.0 - 31.2 pg 30.7   MCHC (Mean Corpuscular Hemoglobin Concentration) 32.0 - 36.0 gm/dL 32.0   Platelet Count 150 - 450 10^3/uL 218   RDW-CV (Red Cell Distribution Width) 11.6 - 14.8 % 13.6   MPV (Mean Platelet Volume) 9.4 - 12.4 fl 10.8   Neutrophils 1.50 - 7.80 10^3/uL 3.75   Lymphocytes 1.00 - 3.60 10^3/uL 1.11   Monocytes 0.00 - 1.50 10^3/uL 0.39   Eosinophils 0.00 - 0.55 10^3/uL 0.00   Basophils 0.00 - 0.09 10^3/uL 0.03   Neutrophil % 32.0 - 70.0 % 70.7 High    Lymphocyte % 10.0 - 50.0 % 20.9   Monocyte % 4.0 - 13.0 % 7.4   Eosinophil % 1.0 - 5.0 % 0.0 Low    Basophil% 0.0 - 2.0 % 0.6   Immature Granulocyte % <=0.7 % 0.4   Immature Granulocyte Count <=0.06 10^3/L 0.02   Resulting Agency  [facility] - LAB   Specimen Collected: 09/17/21 08:04 Last Resulted: 09/17/21 09:04  Received From: [facility]  Result Received: 09/22/21 13:14   Received Information   CBC w/auto Differential (5 Part) (Order [id number])  [facility]  Outside Information     suggestion  Information displayed in this report may not trend or trigger automated decision support.    Contains abnormal data CBC w/auto Differential (5 Part) Order: [id number]  Ref Range & Units 3 mo ago  WBC (White Blood Cell Count) 4.1 - 10.2 10^3/uL 5.3   RBC (Red Blood Cell Count) 4.04 - 5.48 10^6/uL 4.27   Hemoglobin 12.0 - 15.0 gm/dL  13.1   Hematocrit 35.0 - 47.0 % 40.9   MCV (Mean Corpuscular Volume) 80.0 - 100.0 fl 95.8   MCH (Mean Corpuscular Hemoglobin) 27.0 - 31.2 pg 30.7   MCHC (Mean Corpuscular Hemoglobin Concentration) 32.0 - 36.0 gm/dL 32.0   Platelet Count 150 - 450 10^3/uL 218   RDW-CV (Red Cell Distribution Width) 11.6 - 14.8 % 13.6   MPV (Mean Platelet Volume) 9.4 - 12.4 fl 10.8   Neutrophils 1.50 - 7.80 10^3/uL 3.75   Lymphocytes 1.00 - 3.60 10^3/uL 1.11   Monocytes 0.00 - 1.50 10^3/uL 0.39   Eosinophils 0.00 - 0.55 10^3/uL 0.00   Basophils 0.00 - 0.09 10^3/uL 0.03   Neutrophil % 32.0 - 70.0 % 70.7 High    Lymphocyte % 10.0 - 50.0 % 20.9   Monocyte % 4.0 - 13.0 % 7.4   Eosinophil % 1.0 - 5.0 % 0.0 Low    Basophil% 0.0 - 2.0 % 0.6   Immature Granulocyte % <=0.7 % 0.4   Immature Granulocyte Count <=0.06 10^3/L 0.02   Resulting Agency  [facility] - LAB   Specimen Collected: 09/17/21 08:04 Last Resulted: 09/17/21 09:04  Received From: [facility]  Result Received: 09/22/21 13:14    Ref Range & Units 3 mo ago  Glucose 70 - 110 mg/dL 98   Sodium 136 - 145 mmol/L 143   Potassium 3.6 - 5.1 mmol/L 4.3   Chloride 97 - 109 mmol/L 109   Carbon Dioxide (CO2) 22.0 - 32.0 mmol/L 29.8   Urea Nitrogen (BUN) 7 - 25 mg/dL 21   Creatinine 0.6 - 1.1 mg/dL 0.8   Glomerular Filtration Rate (eGFR), MDRD Estimate >60 mL/min/1.73sq m 72   Calcium 8.7 - 10.3 mg/dL 10.0   AST  8 - 39 U/L 9   ALT  5 - 38 U/L 12   Alk Phos (alkaline  Phosphatase) 34 - 104 U/L 91   Albumin 3.5 - 4.8 g/dL 4.0   Bilirubin, Total 0.3 - 1.2 mg/dL 0.5   Protein, Total 6.1 - 7.9 g/dL 6.3   A/G Ratio 1.0 - 5.0 gm/dL 1.7   Resulting Agency  [facility] - LAB   Specimen Collected: 09/17/21 08:04 Last Resulted: 09/17/21 09:43  Received From: [facility]  Result Received: 09/22/21 13:14  I have reviewed the labs.     Pertinent Imaging: CLINICAL DATA:  Right flank pain radiating into the groin intermittently for 7 months. Intermittent gross hematuria. History of kidney stones.   EXAM: CT ABDOMEN AND PELVIS WITHOUT AND WITH CONTRAST   TECHNIQUE: Multidetector CT imaging of the abdomen and pelvis was performed following the standard protocol before and following the bolus administration of intravenous contrast.   RADIATION DOSE REDUCTION: This exam was performed according to the departmental dose-optimization program which includes automated exposure control, adjustment of the mA and/or kV according to patient size and/or use of iterative reconstruction technique.   CONTRAST:  156mL OMNIPAQUE IOHEXOL 300 MG/ML  SOLN   COMPARISON:  Noncontrast abdominopelvic CT 07/16/2021   FINDINGS: Lower chest: Clear lung bases. No significant pleural or pericardial effusion. Aortic and coronary artery atherosclerosis. Probable calcifications of the aortic valve.   Hepatobiliary: Decreased hepatic attenuation consistent with steatosis. No focal lesion or abnormal enhancement. Small gallstones. No evidence of gallbladder wall thickening or biliary dilatation.   Pancreas: Unremarkable. No pancreatic ductal dilatation or surrounding inflammatory changes.   Spleen: Normal in size without focal abnormality.   Adrenals/Urinary Tract: Both adrenal  glands appear normal. Pre contrast images demonstrate several left greater than right renal calculi bilaterally, largest in the upper pole of the left kidney measuring 6 mm. No  residual ureteral or bladder calculi are seen. Post-contrast, both kidneys enhance normally. There is no evidence of enhancing renal mass. There is no hydronephrosis or perinephric soft tissue stranding. Delayed images result in segmental visualization of the ureters. No focal upper tract urothelial abnormalities are identified. The bladder appears unremarkable.   Stomach/Bowel: No enteric contrast administered. A large submucosal lipomas again seen within the distal stomach, measuring up to 3.3 cm on image 18/10. No evidence of bowel wall thickening, distention or surrounding inflammation. The appendix appears normal. There are mild diverticular changes within the descending and sigmoid colon.   Vascular/Lymphatic: There are no enlarged abdominal or pelvic lymph nodes. Aortic and branch vessel atherosclerosis without evidence of aneurysm or large vessel occlusion.   Reproductive: The uterus and ovaries appear unremarkable. No adnexal mass.   Other: No evidence of abdominal wall mass or hernia. No ascites.   Musculoskeletal: No acute or significant osseous findings. Lower lumbar spondylosis without evidence of high-grade spinal stenosis.   IMPRESSION: 1. Interval passage of previously demonstrated small distal right ureteral calculus. No current ureteral or bladder calculi demonstrated. 2. Nonobstructing bilateral renal calculi. 3. No evidence of renal mass or focal urothelial lesion. 4. Stable incidental findings including hepatic steatosis, cholelithiasis, a submucosal gastric lipoma and distal colonic diverticulosis.     Electronically Signed   By: Richardean Sale M.D.   On: 12/31/2021 16:40 I have personally reviewed the images (see HPI) and agree with radiologist interpretation.   Assessment & Plan:    1. Mixed incontinence - Continue oxybutynin 10 mg XL daily and myrbetriq samples   2. High risk hematuria -likely secondary to bilateral renal stones which she will  not address at this time for insurance issues -no reports of gross heme  Return for keep follow up with Dr. Matilde Sprang in December.  [place] [address], [place], [place] [address] [phone]  I, Kirke Shaggy Littlejohn,acting as a scribe for [facility], PA-C.,have documented all relevant documentation on the behalf of Aundra Espin, PA-C,as directed by  [facility], PA-C while in the presence of [place], PA-C.  I have reviewed the above documentation for accuracy and completeness, and I agree with the above.    Zara Council, PA-C

## 2022-03-11 ENCOUNTER — Other Ambulatory Visit: Payer: Self-pay | Admitting: Internal Medicine

## 2022-03-11 DIAGNOSIS — N644 Mastodynia: Secondary | ICD-10-CM

## 2022-05-09 ENCOUNTER — Ambulatory Visit (INDEPENDENT_AMBULATORY_CARE_PROVIDER_SITE_OTHER): Payer: Self-pay | Admitting: Urology

## 2022-05-09 VITALS — BP 174/77 | HR 59 | Ht 61.0 in | Wt 264.2 lb

## 2022-05-09 DIAGNOSIS — N3946 Mixed incontinence: Secondary | ICD-10-CM

## 2022-05-09 MED ORDER — OXYBUTYNIN CHLORIDE ER 10 MG PO TB24: 10.0000 mg | ORAL_TABLET | Freq: Every day | ORAL | 3 refills | Status: DC

## 2022-05-09 MED ORDER — MIRABEGRON ER 25 MG PO TB24: 25.0000 mg | ORAL_TABLET | Freq: Every day | ORAL | 0 refills | Status: DC

## 2022-05-09 NOTE — Progress Notes (Signed)
05/09/2022 9:34 AM   Angelica Hess [birth year] [id number]  Referring provider: Idelle Crouch, MD [place] [facility] [place],  [place] [address]  Chief Complaint  Patient presents with   Follow-up    incontinence    HPI: I was consulted to assess the patient's 66-monthhistory of urinary incontinence.  She leaks with coughing sneezing laughing but not bending lifting.  She has urge incontinence with little warning.  Both are significant.  I do not think she has bedwetting.  She wears 6-10 pads a day moderately wet or soaked.  She reported a distant history of blood in the urine   She voids every 1 or 2 hours.  In dental office sometimes she has to void every 20 minutes due to urgency.  She cannot hold it for 2 hours.   Exam a bit limited due to obesity.  Well supported bladder neck and negative cough test   Patient has high-volume mixed incontinence.  Role of cystoscopy and urodynamics discussed.  No blood in urine today.  She smoked many years ago.  Role of CT scan discussed.  Patient currently does not have insurance.  We will get a try her on oxybutynin ER 10 mg with 30 tabs 11 refills I will reassess her in 6 weeks.  We will call her and tell her how much it costs for cystoscopy and CT scan and she might be willing to pay but we want to get the numbers first.  I held off on urodynamics because of cost and hopefully she will get insurance in the future.   No CT scan performed.  Urine culture negative Much less urgency and can hold urination but still pad count similar.  Clinically not infected   Cystoscopy: Normal   Patient still does not have insurance.  Reassess in 6 weeks on oxybutynin ER 10 mg in combination with Myrbetriq 50 mg samples.  Taken into account insurance status   Patient is dramatically better by 80 to 90% on combination treatment.  The samples ran out she cannot afford them due to no insurance.  She still on oxybutynin with some improvement.      Urge incontinence dramatically better. I gave 3 months of Myrbetriq samples.  She will come in every 3 months and picks them up.  I called in oxybutynin 30x11.     Today When I saw her last year and she saw the nurse practitioner as well she was trying to pass a 2 mm stone on the right side.   Patient has good control on oxybutynin and taking the Myrbetriq every 2 or 3 days.  She had a CT scan stone protocol July 02, 2021 and a 2 mm stone had migrated to the right ureterovesical junction.  She did not have follow-up.  She still has intermittent discomfort in the right flank and right lower quadrant but was a bit nonspecific.  The patient had had the stone for a number of months before the CT scan.  I thought it was best to rescan to make certain she has passed the stone.  I will have her see our nurse practitioner at the CT stone protocol.  If she still has a stone she may need to see one of my partners for ureteroscopy due to its chronic nature.  More Myrbetriq samples given but at 25 mg due to availability of samples.  Reassess on oxybutynin and Myrbetriq in 6 months.  Call if culture positive.  Importantly she  did not have microscopic hematuria today     Today Patient did pass the stone.  Has a very well on oxybutynin and Myrbetriq 25 mg every second day.  No infections.  When she stopped the Myrbetriq her incontinence worsens   PMH: Past Medical History:  Diagnosis Date   Fibromyalgia    Genital herpes    Hypertension    Hypothyroidism    Sleep apnea     Surgical History: Past Surgical History:  Procedure Laterality Date   CESAREAN SECTION     TEMPOROMANDIBULAR JOINT SURGERY      Home Medications:  Allergies as of 05/09/2022       Reactions   Penicillins Itching   Sulfa Antibiotics    Allergy as a child        Medication List        Accurate as of May 09, 2022  9:34 AM. If you have any questions, ask your nurse or doctor.          fexofenadine 180 MG  tablet Commonly known as: ALLEGRA Take 1 tablet (180 mg total) by mouth daily.   hydrochlorothiazide 25 MG tablet Commonly known as: HYDRODIURIL Take 1 tablet by mouth daily.   ibuprofen 200 MG tablet Commonly known as: ADVIL Take 200 mg by mouth as needed.   levothyroxine 50 MCG tablet Commonly known as: SYNTHROID Take 1 tablet (50 mcg total) by mouth daily.   metoprolol tartrate 25 MG tablet Commonly known as: LOPRESSOR Take 25 mg by mouth 2 (two) times daily.   mirabegron ER 50 MG Tb24 tablet Commonly known as: MYRBETRIQ Take 1 tablet (50 mg total) by mouth every other day. [id number] Exp 08/2023   olmesartan 40 MG tablet Commonly known as: BENICAR Take 40 mg by mouth daily.   oxybutynin 10 MG 24 hr tablet Commonly known as: DITROPAN-XL Take 1 tablet (10 mg total) by mouth daily.   topiramate 50 MG tablet Commonly known as: TOPAMAX Take 50 mg by mouth 2 (two) times daily.   valACYclovir 500 MG tablet Commonly known as: VALTREX Take 1 tablet by mouth 2 (two) times daily.   Vitamin D (Ergocalciferol) 1.25 MG (50000 UNIT) Caps capsule Commonly known as: DRISDOL Take 50,000 Units by mouth once a week.        Allergies:  Allergies  Allergen Reactions   Penicillins Itching   Sulfa Antibiotics     Allergy as a child    Family History: Family History  Problem Relation Age of Onset   Alzheimer's disease Mother    Breast cancer Mother 58    Social History:  reports that she has quit smoking. She has been exposed to tobacco smoke. She has never used smokeless tobacco. She reports that she does not drink alcohol and does not use drugs.  ROS:                                        Physical Exam: Ht '5\' 1"'$  (1.549 m)   BMI 49.50 kg/m   Constitutional:  Alert and oriented, No acute distress. HEENT: [place] AT, moist mucus membranes.  Trachea midline, no masses.   Laboratory Data: Lab Results  Component Value Date   WBC 7.4 05/30/2021    HGB 14.3 05/30/2021   HCT 44.1 05/30/2021   MCV 96.9 05/30/2021   PLT 202 05/30/2021    Lab Results  Component Value Date  CREATININE 0.90 12/31/2021    No results found for: "PSA"  No results found for: "TESTOSTERONE"  Lab Results  Component Value Date   HGBA1C 5.6 05/14/2018    Urinalysis    Component Value Date/Time   COLORURINE YELLOW 05/30/2021 1910   APPEARANCEUR Clear 11/08/2021 0922   LABSPEC 1.010 05/30/2021 1910   PHURINE 7.0 05/30/2021 1910   GLUCOSEU Negative 11/08/2021 0922   HGBUR LARGE (A) 05/30/2021 1910   BILIRUBINUR Negative 11/08/2021 0922   KETONESUR 15 (A) 05/30/2021 1910   PROTEINUR Negative 11/08/2021 0922   PROTEINUR NEGATIVE 05/30/2021 1910   UROBILINOGEN 0.2 10/25/2018 1437   UROBILINOGEN 0.2 11/02/2009 1300   NITRITE Negative 11/08/2021 0922   NITRITE NEGATIVE 05/30/2021 1910   LEUKOCYTESUR Negative 11/08/2021 0922   LEUKOCYTESUR NEGATIVE 05/30/2021 1910    Pertinent Imaging:   Assessment & Plan: Oxybutynin prescription 30 x 11 renewed.  3 months of Myrbetriq given and see again in 6 months for samples  1. Mixed incontinence  - Urinalysis, Complete   No follow-ups on file.  Reece Packer, MD  [place] [address], [place], [place] [address] [phone]

## 2022-05-09 NOTE — Addendum Note (Signed)
Addended by: Chrystie Nose on: 05/09/2022 01:21 PM   Modules accepted: Orders

## 2022-05-09 NOTE — Addendum Note (Signed)
Addended by: Kris Mouton on: 05/09/2022 04:06 PM   Modules accepted: Orders

## 2022-11-07 ENCOUNTER — Ambulatory Visit (INDEPENDENT_AMBULATORY_CARE_PROVIDER_SITE_OTHER): Payer: Self-pay | Admitting: Urology

## 2022-11-07 ENCOUNTER — Encounter: Payer: Self-pay | Admitting: Urology

## 2022-11-07 VITALS — Ht 61.0 in

## 2022-11-07 DIAGNOSIS — N3946 Mixed incontinence: Secondary | ICD-10-CM

## 2022-11-07 MED ORDER — OXYBUTYNIN CHLORIDE ER 10 MG PO TB24
10.0000 mg | ORAL_TABLET | Freq: Every day | ORAL | 3 refills | Status: DC
Start: 2022-11-07 — End: 2022-11-07

## 2022-11-07 MED ORDER — GEMTESA 75 MG PO TABS: 1.0000 | ORAL_TABLET | ORAL | 0 refills | Status: DC

## 2022-11-07 MED ORDER — OXYBUTYNIN CHLORIDE ER 10 MG PO TB24
10.0000 mg | ORAL_TABLET | Freq: Every day | ORAL | 3 refills | Status: DC
Start: 2022-11-07 — End: 2023-02-06

## 2022-11-07 NOTE — Progress Notes (Signed)
11/07/2022 10:24 AM   Angelica Hess [birth year] [id number]  Referring provider: Marguarite Arbour, MD [address] [facility] [place],  [place] [address]  Chief Complaint  Patient presents with   Follow-up    HPI: Reviewed note Patient did pass the stone. Has a very well on oxybutynin and Myrbetriq 25 mg every second day. No infections. When she stopped the Myrbetriq her incontinence worsens Oxybutynin prescription 30 x 11 renewed.  3 months of Myrbetriq given and see again in 6 months for samples She could not afford the Myrbetriq and was taking it every second day with the oxybutynin and doing very well  Today        PMH: Past Medical History:  Diagnosis Date   Fibromyalgia    Genital herpes    Hypertension    Hypothyroidism    Sleep apnea     Surgical History: Past Surgical History:  Procedure Laterality Date   CESAREAN SECTION     TEMPOROMANDIBULAR JOINT SURGERY      Home Medications:  Allergies as of 11/07/2022       Reactions   Penicillins Itching   Sulfa Antibiotics    Allergy as a child        Medication List        Accurate as of November 07, 2022 10:24 AM. If you have any questions, ask your nurse or doctor.          fexofenadine 180 MG tablet Commonly known as: ALLEGRA Take 1 tablet (180 mg total) by mouth daily.   hydrochlorothiazide 25 MG tablet Commonly known as: HYDRODIURIL Take 1 tablet by mouth daily.   ibuprofen 200 MG tablet Commonly known as: ADVIL Take 200 mg by mouth as needed.   levothyroxine 50 MCG tablet Commonly known as: SYNTHROID Take 1 tablet (50 mcg total) by mouth daily.   metoprolol tartrate 25 MG tablet Commonly known as: LOPRESSOR Take 25 mg by mouth 2 (two) times daily.   mirabegron ER 25 MG Tb24 tablet Commonly known as: MYRBETRIQ Take 1 tablet (25 mg total) by mouth daily.   olmesartan 40 MG tablet Commonly known as: BENICAR Take 40 mg by mouth daily.   oxybutynin 10 MG 24 hr  tablet Commonly known as: DITROPAN-XL Take 1 tablet (10 mg total) by mouth daily.   topiramate 50 MG tablet Commonly known as: TOPAMAX Take 50 mg by mouth 2 (two) times daily.   valACYclovir 500 MG tablet Commonly known as: VALTREX Take 1 tablet by mouth 2 (two) times daily.   Vitamin D (Ergocalciferol) 1.25 MG (50000 UNIT) Caps capsule Commonly known as: DRISDOL Take 50,000 Units by mouth once a week.        Allergies:  Allergies  Allergen Reactions   Penicillins Itching   Sulfa Antibiotics     Allergy as a child    Family History: Family History  Problem Relation Age of Onset   Alzheimer's disease Mother    Breast cancer Mother 49    Social History:  reports that she has quit smoking. She has been exposed to tobacco smoke. She has never used smokeless tobacco. She reports that she does not drink alcohol and does not use drugs.  ROS:                                        Physical Exam: There were no vitals taken for  this visit.  Constitutional:  Alert and oriented, No acute distress. HEENT: [place] AT, moist mucus membranes.  Trachea midline, no masses.   Laboratory Data: Lab Results  Component Value Date   WBC 7.4 05/30/2021   HGB 14.3 05/30/2021   HCT 44.1 05/30/2021   MCV 96.9 05/30/2021   PLT 202 05/30/2021    Lab Results  Component Value Date   CREATININE 0.90 12/31/2021    No results found for: "PSA"  No results found for: "TESTOSTERONE"  Lab Results  Component Value Date   HGBA1C 5.6 05/14/2018    Urinalysis    Component Value Date/Time   COLORURINE YELLOW 05/30/2021 1910   APPEARANCEUR Clear 11/08/2021 0922   LABSPEC 1.010 05/30/2021 1910   PHURINE 7.0 05/30/2021 1910   GLUCOSEU Negative 11/08/2021 0922   HGBUR LARGE (A) 05/30/2021 1910   BILIRUBINUR Negative 11/08/2021 0922   KETONESUR 15 (A) 05/30/2021 1910   PROTEINUR Negative 11/08/2021 0922   PROTEINUR NEGATIVE 05/30/2021 1910   UROBILINOGEN 0.2  10/25/2018 1437   UROBILINOGEN 0.2 11/02/2009 1300   NITRITE Negative 11/08/2021 0922   NITRITE NEGATIVE 05/30/2021 1910   LEUKOCYTESUR Negative 11/08/2021 0922   LEUKOCYTESUR NEGATIVE 05/30/2021 1910    Pertinent Imaging:   Assessment & Plan: She will stay on oxybutynin renewed.  Take Gemtesa every 2 days.  Reassess in 3 months 6 weeks of samples given.  Proceed accordingly  1. Mixed incontinence  - Urinalysis, Complete   No follow-ups on file.  Martina Sinner, MD  [facility] [address] [place], [place] [address] [phone]

## 2023-02-06 ENCOUNTER — Ambulatory Visit (INDEPENDENT_AMBULATORY_CARE_PROVIDER_SITE_OTHER): Payer: Self-pay | Admitting: Urology

## 2023-02-06 DIAGNOSIS — N3946 Mixed incontinence: Secondary | ICD-10-CM

## 2023-02-06 LAB — URINALYSIS, COMPLETE
Bilirubin, UA: NEGATIVE
Glucose, UA: NEGATIVE
Ketones, UA: NEGATIVE
Nitrite, UA: NEGATIVE
RBC, UA: NEGATIVE
Specific Gravity, UA: 1.015 (ref 1.005–1.030)
Urobilinogen, Ur: 0.2 mg/dL (ref 0.2–1.0)
pH, UA: 7 (ref 5.0–7.5)

## 2023-02-06 LAB — MICROSCOPIC EXAMINATION

## 2023-02-06 MED ORDER — OXYBUTYNIN CHLORIDE ER 10 MG PO TB24
10.0000 mg | ORAL_TABLET | Freq: Every day | ORAL | 3 refills | Status: DC
Start: 2023-02-06 — End: 2024-04-09

## 2023-02-06 MED ORDER — GEMTESA 75 MG PO TABS: 75.0000 mg | ORAL_TABLET | Freq: Every day | ORAL | 0 refills | Status: AC

## 2023-02-06 NOTE — Progress Notes (Signed)
02/06/2023 11:12 AM   Angelica Hess 01-30-1956 [id number]  Referring provider: Marguarite Arbour, MD [address] [facility] [place],  [place] [address]  Chief Complaint  Patient presents with   Follow-up    HPI: I was consulted to assess the patient's 8-month history of urinary incontinence.  She leaks with coughing sneezing laughing but not bending lifting.  She has urge incontinence with little warning.  Both are significant.  I do not think she has bedwetting.  She wears 6-10 pads a day moderately wet or soaked.  She reported a distant history of blood in the urine   She voids every 1 or 2 hours.  In dental office sometimes she has to void every 20 minutes due to urgency.  She cannot hold it for 2 hours.   Exam a bit limited due to obesity.  Well supported bladder neck and negative cough test   Patient has high-volume mixed incontinence.  Role of cystoscopy and urodynamics discussed.  No blood in urine today.  She smoked many years ago.  Role of CT scan discussed.  Patient currently does not have insurance.  We will get a try her on oxybutynin ER 10 mg with 30 tabs 11 refills I will reassess her in 6 weeks.  We will call her and tell her how much it costs for cystoscopy and CT scan and she might be willing to pay but we want to get the numbers first.  I held off on urodynamics because of cost and hopefully she will get insurance in the future.   No CT scan performed.  Urine culture negative Much less urgency and can hold urination but still pad count similar.  Clinically not infected   Cystoscopy: Normal   Patient still does not have insurance.  Reassess in 6 weeks on oxybutynin ER 10 mg in combination with Myrbetriq 50 mg samples.  Taken into account insurance status   Patient is dramatically better by 80 to 90% on combination treatment.  The samples ran out she cannot afford them due to no insurance.  She still on oxybutynin with some improvement.     Urge  incontinence dramatically better. I gave 3 months of Myrbetriq samples.  She will come in every 3 months and picks them up.  I called in oxybutynin 30x11.     Today When I saw her last year and she saw the nurse practitioner as well she was trying to pass a 2 mm stone on the right side.   Patient has good control on oxybutynin and taking the Myrbetriq every 2 or 3 days.  She had a CT scan stone protocol July 02, 2021 and a 2 mm stone had migrated to the right ureterovesical junction.  She did not have follow-up.  She still has intermittent discomfort in the right flank and right lower quadrant but was a bit nonspecific.   The patient had had the stone for a number of months before the CT scan.  I thought it was best to rescan to make certain she has passed the stone.  I will have her see our nurse practitioner at the CT stone protocol.  If she still has a stone she may need to see one of my partners for ureteroscopy due to its chronic nature.  More Myrbetriq samples given but at 25 mg due to availability of samples.  Reassess on oxybutynin and Myrbetriq in 6 months.  Call if culture positive.  Importantly she did not have  microscopic hematuria today      Today Patient did pass the stone.  Has a very well on oxybutynin and Myrbetriq 25 mg every second day.  No infections.  When she stopped the Myrbetriq her incontinence worsens Oxybutynin prescription 30 x 11 renewed. 3 months of Myrbetriq given and see again in 6 months for samples   Patient did pass the stone. Has a very well on oxybutynin and Myrbetriq 25 mg every second day. No infections. When she stopped the Myrbetriq her incontinence worsens Oxybutynin prescription 30 x 11 renewed.  3 months of Myrbetriq given and see again in 6 months for samples She could not afford the Myrbetriq and was taking it every second day with the oxybutynin and doing very well  She will stay on oxybutynin renewed. Take Gemtesa every 2 days. Reassess in 3 months 6  weeks of samples given.    Today She thinks the [place] and the oxybutynin worked about the same but she still has days that she can use many pads up to 10.  Clinically not infected but I sent the urine for culture.  Frequency stable.  Still has insurance concerns and does not want workup.    PMH: Past Medical History:  Diagnosis Date   Fibromyalgia    Genital herpes    Hypertension    Hypothyroidism    Sleep apnea     Surgical History: Past Surgical History:  Procedure Laterality Date   CESAREAN SECTION     TEMPOROMANDIBULAR JOINT SURGERY      Home Medications:  Allergies as of 02/06/2023       Reactions   Penicillins Itching   Sulfa Antibiotics    Allergy as a child   Buspirone Anxiety        Medication List        Accurate as of February 06, 2023 11:12 AM. If you have any questions, ask your nurse or doctor.          fexofenadine 180 MG tablet Commonly known as: ALLEGRA Take 1 tablet (180 mg total) by mouth daily.   Gemtesa 75 MG Tabs Generic drug: Vibegron Take 1 tablet (75 mg total) by mouth every other day.   hydrochlorothiazide 25 MG tablet Commonly known as: HYDRODIURIL Take 1 tablet by mouth daily.   ibuprofen 200 MG tablet Commonly known as: ADVIL Take 200 mg by mouth as needed.   levothyroxine 50 MCG tablet Commonly known as: SYNTHROID Take 1 tablet (50 mcg total) by mouth daily.   metoprolol tartrate 25 MG tablet Commonly known as: LOPRESSOR Take 25 mg by mouth 2 (two) times daily.   olmesartan 40 MG tablet Commonly known as: BENICAR Take 40 mg by mouth daily.   oxybutynin 10 MG 24 hr tablet Commonly known as: DITROPAN-XL Take 1 tablet (10 mg total) by mouth daily.   topiramate 50 MG tablet Commonly known as: TOPAMAX Take 50 mg by mouth 2 (two) times daily.   valACYclovir 500 MG tablet Commonly known as: VALTREX Take 1 tablet by mouth 2 (two) times daily.   Vitamin D (Ergocalciferol) 1.25 MG (50000 UNIT) Caps  capsule Commonly known as: DRISDOL Take 50,000 Units by mouth once a week.        Allergies:  Allergies  Allergen Reactions   Penicillins Itching   Sulfa Antibiotics     Allergy as a child   Buspirone Anxiety    Family History: Family History  Problem Relation Age of Onset   Alzheimer's disease Mother  Breast cancer Mother 39    Social History:  reports that she has quit smoking. She has been exposed to tobacco smoke. She has never used smokeless tobacco. She reports that she does not drink alcohol and does not use drugs.  ROS:                                        Physical Exam: There were no vitals taken for this visit.  Constitutional:  Alert and oriented, No acute distress. HEENT: [place] AT, moist mucus membranes.  Trachea midline, no masses.   Laboratory Data: Lab Results  Component Value Date   WBC 7.4 05/30/2021   HGB 14.3 05/30/2021   HCT 44.1 05/30/2021   MCV 96.9 05/30/2021   PLT 202 05/30/2021    Lab Results  Component Value Date   CREATININE 0.90 12/31/2021    No results found for: "PSA"  No results found for: "TESTOSTERONE"  Lab Results  Component Value Date   HGBA1C 5.6 05/14/2018    Urinalysis    Component Value Date/Time   COLORURINE YELLOW 05/30/2021 1910   APPEARANCEUR Clear 11/08/2021 0922   LABSPEC 1.010 05/30/2021 1910   PHURINE 7.0 05/30/2021 1910   GLUCOSEU Negative 11/08/2021 0922   HGBUR LARGE (A) 05/30/2021 1910   BILIRUBINUR Negative 11/08/2021 0922   KETONESUR 15 (A) 05/30/2021 1910   PROTEINUR Negative 11/08/2021 0922   PROTEINUR NEGATIVE 05/30/2021 1910   UROBILINOGEN 0.2 10/25/2018 1437   UROBILINOGEN 0.2 11/02/2009 1300   NITRITE Negative 11/08/2021 0922   NITRITE NEGATIVE 05/30/2021 1910   LEUKOCYTESUR Negative 11/08/2021 0922   LEUKOCYTESUR NEGATIVE 05/30/2021 1910    Pertinent Imaging: Urine reviewed and sent for culture  Assessment & Plan: Gave 8 weeks to [facility].  Take with  oxybutynin and reassess in 4 months and continue this protocol  1. Mixed incontinence  - Urinalysis, Complete   No follow-ups on file.  Martina Sinner, MD  [facility] [address] [place], [place] [address] [phone]

## 2023-02-09 LAB — CULTURE, URINE COMPREHENSIVE

## 2023-02-25 IMAGING — CR DG ABDOMEN 1V
1 series · 2 of 2 positions shown · non-contrast
Comparison: CT abdomen pelvis dated 05/30/2021 and abdominal
radiograph dated 06/25/2021.

CLINICAL DATA: Kidney stones.

EXAM:
ABDOMEN - 1 VIEW

[Series 1: dg abd 1 view · 0.14mm/px · 2 of 2 slices shown]
[im 1/2]
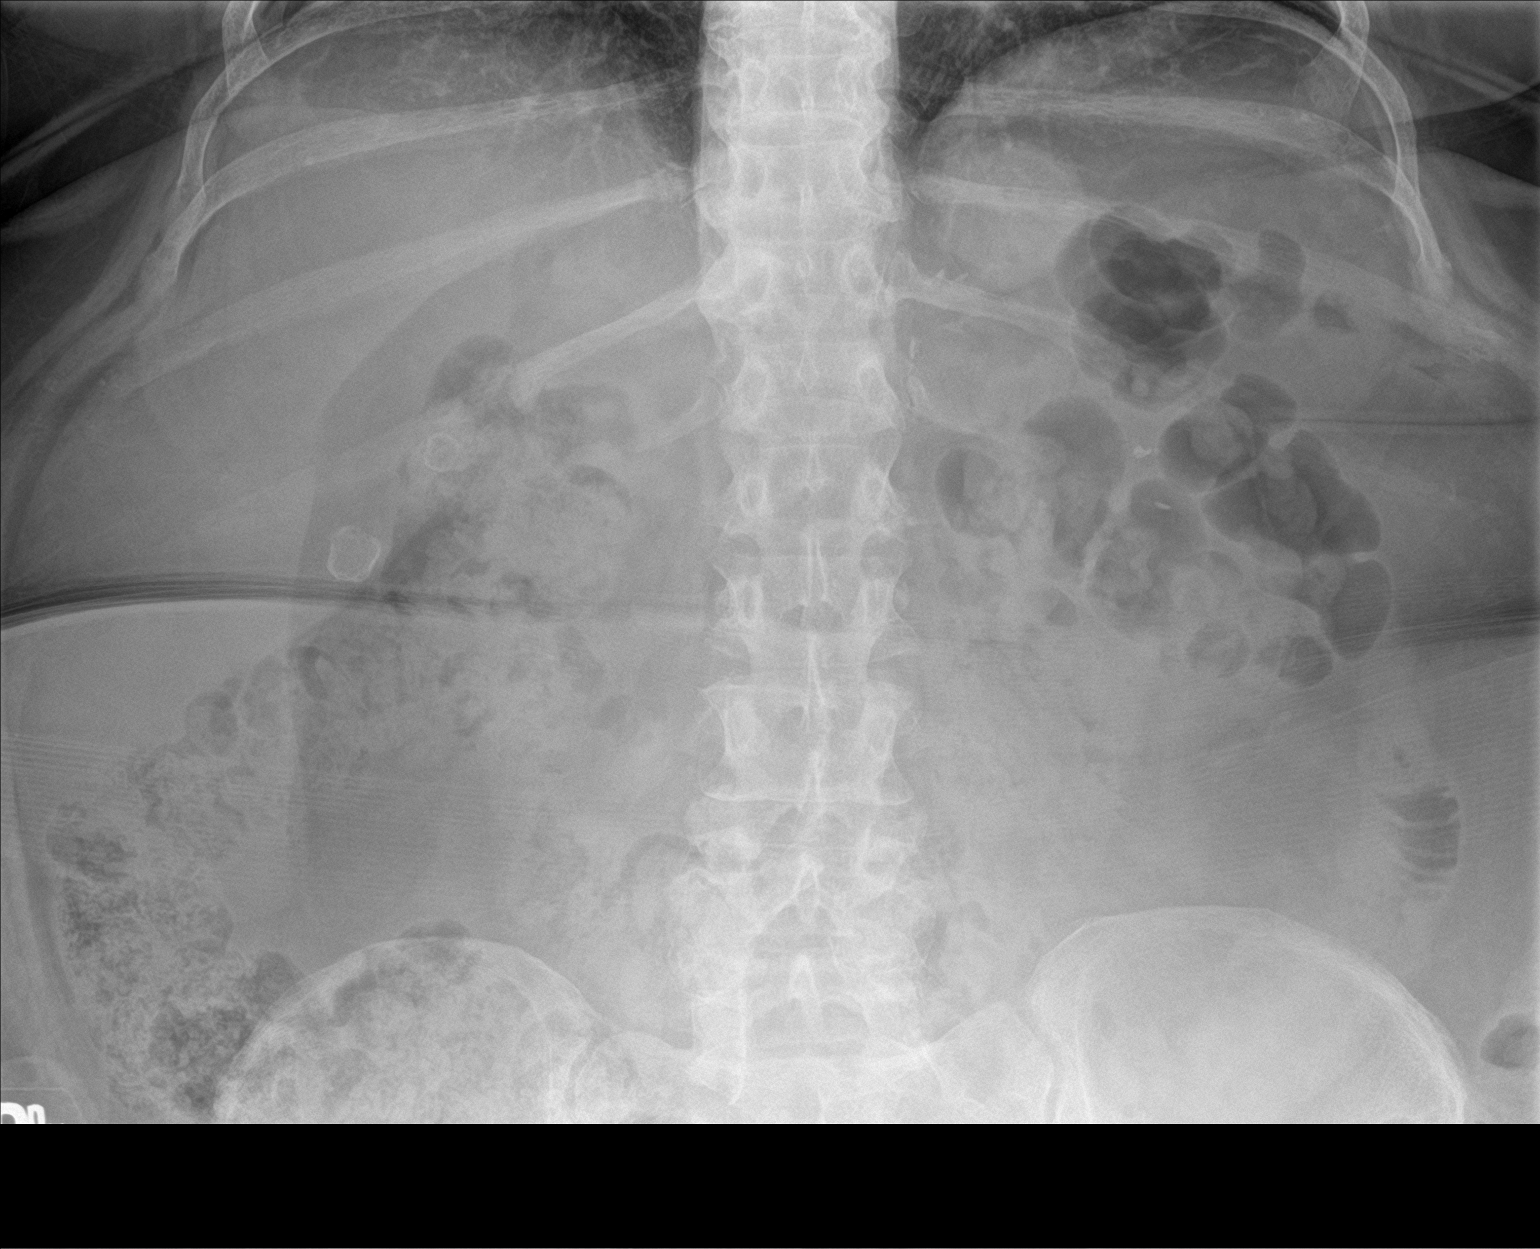
[im 2/2]
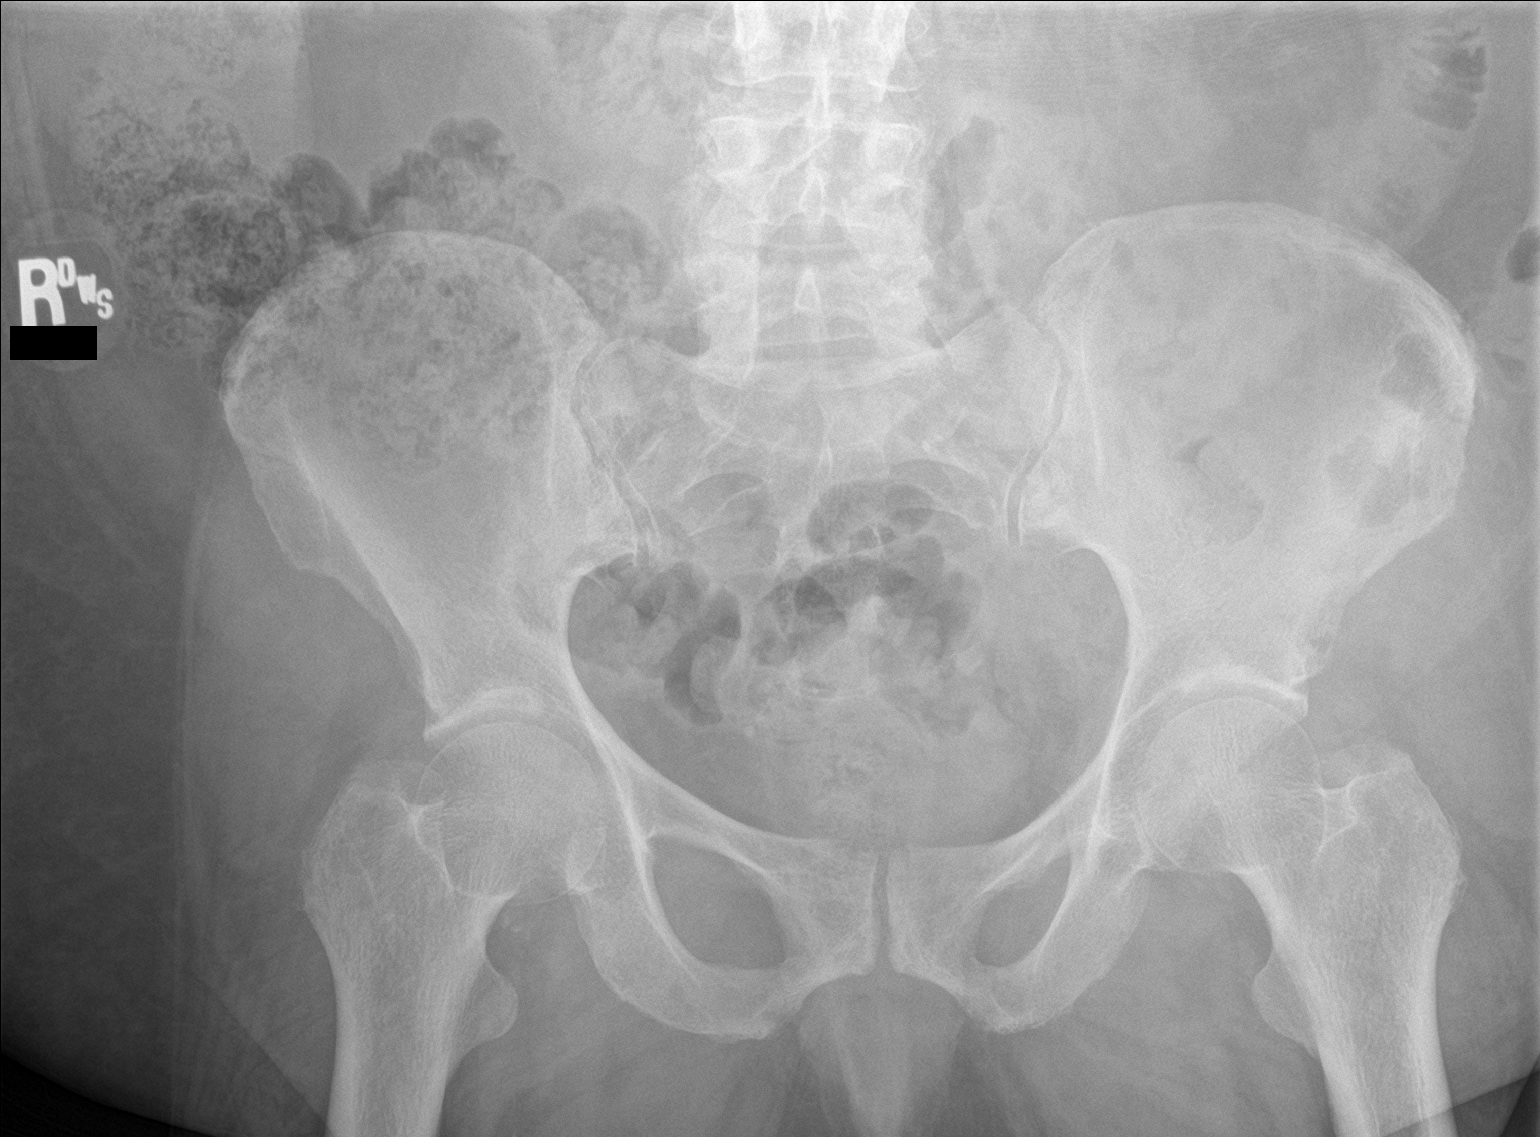

[2 of 2 positions shown; findings below may reference images not displayed]

FINDINGS: There is moderate stool throughout the colon. No bowel dilatation or
evidence of obstruction. Multiple small stones noted over the left
renal silhouette measuring up to 4 mm. Evaluation of the right
kidney is limited due to superimposition of stool within the colon.
Several gallstones. No acute osseous pathology. The soft tissues are
unremarkable.
IMPRESSION: 1. Multiple small left renal stones.
2. Cholelithiasis.

## 2023-03-13 ENCOUNTER — Other Ambulatory Visit: Payer: Self-pay | Admitting: Internal Medicine

## 2023-03-13 DIAGNOSIS — Z1231 Encounter for screening mammogram for malignant neoplasm of breast: Secondary | ICD-10-CM

## 2023-05-26 ENCOUNTER — Other Ambulatory Visit: Payer: Self-pay

## 2023-05-26 DIAGNOSIS — Z1231 Encounter for screening mammogram for malignant neoplasm of breast: Secondary | ICD-10-CM

## 2023-06-05 ENCOUNTER — Ambulatory Visit: Payer: Self-pay | Admitting: Urology

## 2023-06-12 ENCOUNTER — Other Ambulatory Visit: Payer: Self-pay

## 2023-06-12 ENCOUNTER — Ambulatory Visit: Payer: Self-pay

## 2023-06-12 ENCOUNTER — Ambulatory Visit: Payer: Self-pay | Attending: Hematology and Oncology | Admitting: *Deleted

## 2023-06-12 VITALS — BP 181/86 | Wt 258.2 lb

## 2023-06-12 DIAGNOSIS — Z01419 Encounter for gynecological examination (general) (routine) without abnormal findings: Secondary | ICD-10-CM

## 2023-06-12 DIAGNOSIS — N6321 Unspecified lump in the left breast, upper outer quadrant: Secondary | ICD-10-CM

## 2023-06-12 DIAGNOSIS — N6325 Unspecified lump in the left breast, overlapping quadrants: Secondary | ICD-10-CM

## 2023-06-12 NOTE — Patient Instructions (Addendum)
Explained breast self awareness with [name]. Pap smear completed today. Let her know since she is over 68 years old that she will need to establish care with a GYN provider to discuss the need for further Pap smears. Referred patient to the [facility] for a diagnostic mammogram. Appointment scheduled Friday, June 16, 2023 at 1340. Patient aware of appointment and will be there or reschedule if unable to keep appointment. Let patient know will follow up with her within the next couple weeks with results of Pap smear by letter or phone. Angelica Hess verbalized understanding.  Isidore Margraf, Kathaleen Maser, RN 9:27 AM

## 2023-06-12 NOTE — Progress Notes (Signed)
Angelica Hess is a 68 y.o. No obstetric history on file. female who presents to [facility] clinic today with complaint of left breast lump x 6 months.    Pap Smear: Pap smear completed today. Last Pap smear was 12/07/2015 at [facility] clinic at [facility] and was normal. Per patient has history of an abnormal Pap smear 30 years ago that a repeat Pap smear was completed for follow up that was normal. Per patient all Pap smears have been normal since and that she has had at least three normal Pap smears. Patient stated she has a history of Herpes. Last Pap smear result is available in Epic.   Physical exam: Breasts Breasts symmetrical. No skin abnormalities bilateral breasts. No nipple retraction bilateral breasts. No nipple discharge bilateral breasts. No lymphadenopathy. No lumps palpated right breast. Palpated a lump within the left breast at 3 o'clock 16 cm from the nipple. Complaints of bilateral diffuse breast breast pain on exam.  MM DIGITAL SCREENING BILATERAL Result Date: 12/14/2018 CLINICAL DATA:  Screening. EXAM: DIGITAL SCREENING BILATERAL MAMMOGRAM WITH CAD COMPARISON:  Previous exam(s). ACR Breast Density Category b: There are scattered areas of fibroglandular density. FINDINGS: There are no findings suspicious for malignancy. Images were processed with CAD. IMPRESSION: No mammographic evidence of malignancy. A result letter of this screening mammogram will be mailed directly to the patient. RECOMMENDATION: Screening mammogram in one year. (Code:SM-B-01Y) BI-RADS CATEGORY  1: Negative. Electronically Signed   By: Baird Lyons M.D.   On: 12/14/2018 16:16    Pelvic/Bimanual Ext Genitalia No lesions, no swelling and no discharge observed on external genitalia.        Vagina Vagina pink and normal texture. No lesions or discharge observed in vagina.        Cervix Cervix is present. Cervix pink and of normal texture. No discharge observed.    Uterus Uterus is present and palpable.  Uterus in normal position and normal size.        Adnexae Bilateral ovaries present and palpable. No tenderness on palpation.         Rectovaginal No rectal exam completed today since patient had no rectal complaints. No skin abnormalities observed on exam.     Smoking History: Patient is a former smoker that quit over 20 years ago.   Patient Navigation: Patient education provided. Access to services provided for patient through [facility] program.   Colorectal Cancer Screening: Per patient has never had colonoscopy completed. Patient stated she completed a FIT Test in 2024 at her PCP's office and was negative. No complaints today.    Breast and Cervical Cancer Risk Assessment: Per patient has family history of her mom having breast cancer. Patient has no known genetic mutations or history of radiation treatment to the chest before age 89. Patient does not have history of cervical dysplasia, immunocompromised, or DES exposure in-utero.  Risk Scores as of Encounter on 06/12/2023     Angelica Hess           5-year 3.68%   Lifetime 11.63%            Last calculated by Narda Rutherford, LPN on 08/29/8117 at  9:20 AM        A: [facility] exam with pap smear Complaint of left breast lump.  P: Referred patient to the [facility] for a diagnostic mammogram. Appointment scheduled Friday, June 16, 2023 at 1340.  Priscille Heidelberg, RN 06/12/2023 9:27 AM

## 2023-06-15 ENCOUNTER — Telehealth: Payer: Self-pay

## 2023-06-15 LAB — CYTOLOGY - PAP
Comment: NEGATIVE
Diagnosis: NEGATIVE
High risk HPV: NEGATIVE

## 2023-06-15 NOTE — Telephone Encounter (Addendum)
Patient returned call and was informed negative Pap/HPV results, no longer needs pap smears per guidelines, but needs to discuss with pcp. Patient verbalized understanding.    Attempted to contact patient regarding lab results. Left message on voicemail requesting a a return call.

## 2023-06-16 ENCOUNTER — Other Ambulatory Visit: Payer: Self-pay

## 2023-06-19 ENCOUNTER — Ambulatory Visit
Admission: RE | Admit: 2023-06-19 | Discharge: 2023-06-19 | Disposition: A | Discharge status: Home / Self Care | Payer: Self-pay | Source: Ambulatory Visit | Attending: Obstetrics and Gynecology | Admitting: Obstetrics and Gynecology

## 2023-06-19 DIAGNOSIS — N6321 Unspecified lump in the left breast, upper outer quadrant: Secondary | ICD-10-CM | POA: Insufficient documentation

## 2023-07-24 ENCOUNTER — Ambulatory Visit: Payer: Self-pay | Admitting: Urology

## 2023-08-27 ENCOUNTER — Emergency Department (HOSPITAL_COMMUNITY): Payer: Self-pay

## 2023-08-27 ENCOUNTER — Emergency Department (HOSPITAL_COMMUNITY)
Admission: EM | Admit: 2023-08-27 | Discharge: 2023-08-27 | Disposition: A | Discharge status: Home / Self Care | Payer: Self-pay | Attending: Emergency Medicine | Admitting: Emergency Medicine

## 2023-08-27 ENCOUNTER — Encounter (HOSPITAL_COMMUNITY): Payer: Self-pay

## 2023-08-27 ENCOUNTER — Other Ambulatory Visit: Payer: Self-pay

## 2023-08-27 DIAGNOSIS — Z79890 Hormone replacement therapy (postmenopausal): Secondary | ICD-10-CM | POA: Insufficient documentation

## 2023-08-27 DIAGNOSIS — E039 Hypothyroidism, unspecified: Secondary | ICD-10-CM | POA: Insufficient documentation

## 2023-08-27 DIAGNOSIS — I10 Essential (primary) hypertension: Secondary | ICD-10-CM | POA: Insufficient documentation

## 2023-08-27 DIAGNOSIS — Z79899 Other long term (current) drug therapy: Secondary | ICD-10-CM | POA: Insufficient documentation

## 2023-08-27 DIAGNOSIS — R109 Unspecified abdominal pain: Secondary | ICD-10-CM | POA: Insufficient documentation

## 2023-08-27 DIAGNOSIS — M5431 Sciatica, right side: Secondary | ICD-10-CM

## 2023-08-27 LAB — URINALYSIS, ROUTINE W REFLEX MICROSCOPIC
Bilirubin Urine: NEGATIVE
Glucose, UA: NEGATIVE mg/dL
Hgb urine dipstick: NEGATIVE
Ketones, ur: NEGATIVE mg/dL
Leukocytes,Ua: NEGATIVE
Nitrite: NEGATIVE
Protein, ur: NEGATIVE mg/dL
Specific Gravity, Urine: 1.013 (ref 1.005–1.030)
pH: 7 (ref 5.0–8.0)

## 2023-08-27 LAB — BASIC METABOLIC PANEL WITH GFR
Anion gap: 9 (ref 5–15)
BUN: 14 mg/dL (ref 8–23)
CO2: 27 mmol/L (ref 22–32)
Calcium: 10 mg/dL (ref 8.9–10.3)
Chloride: 105 mmol/L (ref 98–111)
Creatinine, Ser: 0.76 mg/dL (ref 0.44–1.00)
GFR, Estimated: >60 mL/min (ref >60)
Glucose, Bld: 124 mg/dL — ABNORMAL HIGH (ref 70–99)
Potassium: 4.1 mmol/L (ref 3.5–5.1)
Sodium: 141 mmol/L (ref 135–145)

## 2023-08-27 LAB — HEPATIC FUNCTION PANEL
ALT: 14 U/L (ref 0–44)
AST: 15 U/L (ref 15–41)
Albumin: 3.9 g/dL (ref 3.5–5.0)
Alkaline Phosphatase: 84 U/L (ref 38–126)
Bilirubin, Direct: 0.1 mg/dL (ref 0.0–0.2)
Indirect Bilirubin: 0.5 mg/dL (ref 0.3–0.9)
Total Bilirubin: 0.6 mg/dL (ref 0.0–1.2)
Total Protein: 7.3 g/dL (ref 6.5–8.1)

## 2023-08-27 LAB — CBC
HCT: 45.5 % (ref 36.0–46.0)
Hemoglobin: 14.7 g/dL (ref 12.0–15.0)
MCH: 30.9 pg (ref 26.0–34.0)
MCHC: 32.3 g/dL (ref 30.0–36.0)
MCV: 95.6 fL (ref 80.0–100.0)
Platelets: 218 10*3/uL (ref 150–400)
RBC: 4.76 MIL/uL (ref 3.87–5.11)
RDW: 13 % (ref 11.5–15.5)
WBC: 7.6 10*3/uL (ref 4.0–10.5)
nRBC: 0 % (ref 0.0–0.2)

## 2023-08-27 LAB — LIPASE, BLOOD: Lipase: 30 U/L (ref 11–51)

## 2023-08-27 MED ORDER — MORPHINE SULFATE (PF) 4 MG/ML IV SOLN
4.0000 mg | Freq: Once | INTRAVENOUS | Status: AC
  Administered 2023-08-27: 4 mg via INTRAVENOUS
  Filled 2023-08-27: qty 1

## 2023-08-27 MED ORDER — METHOCARBAMOL 500 MG PO TABS: 500.0000 mg | ORAL_TABLET | Freq: Two times a day (BID) | ORAL | 0 refills | Status: AC

## 2023-08-27 MED ORDER — KETOROLAC TROMETHAMINE 15 MG/ML IJ SOLN
15.0000 mg | Freq: Once | INTRAMUSCULAR | Status: AC
  Administered 2023-08-27: 15 mg via INTRAVENOUS
  Filled 2023-08-27: qty 1

## 2023-08-27 NOTE — ED Notes (Signed)
 Patient transported to Ultrasound

## 2023-08-27 NOTE — Discharge Instructions (Addendum)

## 2023-08-27 NOTE — ED Provider Notes (Signed)
 [place] EMERGENCY DEPARTMENT AT [facility] Provider Note   CSN: [id number] Arrival date & time: 08/27/23  1016     History  Chief Complaint  Patient presents with   Flank Pain    Fatema Natividad is a 68 y.o. female with past medical history significant for hypertension, fibromyalgia, hypothyroidism, who presents with concern for sharp flank pain on the right since this morning. Denies dysuria. Does endorse history of kidney stones. Nothing for pain prior to arrival. She denies any new fall or injury. Pain radiates through right groin crease.   Flank Pain       Home Medications Prior to Admission medications   Medication Sig Start Date End Date Taking? Authorizing Provider  fexofenadine (ALLEGRA) 180 MG tablet Take 1 tablet (180 mg total) by mouth daily. 05/15/18   Lorre Munroe, NP  hydrochlorothiazide (HYDRODIURIL) 25 MG tablet Take 1 tablet by mouth daily. 08/22/20   [provider]  ibuprofen (ADVIL,MOTRIN) 200 MG tablet Take 200 mg by mouth as needed.    [provider]  levothyroxine (SYNTHROID, LEVOTHROID) 50 MCG tablet Take 1 tablet (50 mcg total) by mouth daily. 05/15/18   Lorre Munroe, NP  metoprolol tartrate (LOPRESSOR) 25 MG tablet Take 25 mg by mouth 2 (two) times daily. 08/21/20   [provider]  olmesartan (BENICAR) 40 MG tablet Take 40 mg by mouth daily. 09/10/20   [provider]  oxybutynin (DITROPAN-XL) 10 MG 24 hr tablet Take 1 tablet (10 mg total) by mouth daily. 02/06/23   Alfredo Martinez, MD  topiramate (TOPAMAX) 50 MG tablet Take 50 mg by mouth 2 (two) times daily. 10/07/20   [provider]  valACYclovir (VALTREX) 500 MG tablet Take 1 tablet by mouth 2 (two) times daily. 08/21/20   [provider]  Vibegron (GEMTESA) 75 MG TABS Take 1 tablet (75 mg total) by mouth every other day. 11/07/22   MacDiarmid, Lorin Picket, MD  Vibegron (GEMTESA) 75 MG TABS Take 1 tablet (75 mg total) by mouth daily. 02/06/23    Alfredo Martinez, MD  Vitamin D, Ergocalciferol, (DRISDOL) 1.25 MG (50000 UNIT) CAPS capsule Take 50,000 Units by mouth once a week. 05/03/21   [provider]      Allergies    Penicillins, Sulfa antibiotics, and Buspirone    Review of Systems   Review of Systems  Genitourinary:  Positive for flank pain.  All other systems reviewed and are negative.   Physical Exam Updated Vital Signs BP (!) 213/94 (BP Location: Right Wrist)   Pulse 60   Temp 98 F (36.7 C)   Resp 20   Ht 5\' 1"  (1.549 m)   Wt 117 kg   SpO2 100%   BMI 48.75 kg/m  Physical Exam Vitals and nursing note reviewed.  Constitutional:      General: She is not in acute distress.    Appearance: Normal appearance.  HENT:     Head: Normocephalic and atraumatic.  Eyes:     General:        Right eye: No discharge.        Left eye: No discharge.  Cardiovascular:     Rate and Rhythm: Normal rate and regular rhythm.     Heart sounds: No murmur heard.    No friction rub. No gallop.  Pulmonary:     Effort: Pulmonary effort is normal.     Breath sounds: Normal breath sounds.  Abdominal:     General: Bowel sounds are normal.  Palpations: Abdomen is soft.     Comments: Focal tenderness to palpation in the right flank, positive CVA tenderness, tenderness palpation extends towards the right suprapubic region  Some pain does radiate up into the right upper quadrant and epigastric region, some questionable Murphy sign.  Skin:    General: Skin is warm and dry.     Capillary Refill: Capillary refill takes less than 2 seconds.  Neurological:     Mental Status: She is alert and oriented to person, place, and time.  Psychiatric:        Mood and Affect: Mood normal.        Behavior: Behavior normal.     ED Results / Procedures / Treatments   Labs (all labs ordered are listed, but only abnormal results are displayed) Labs Reviewed  CBC  URINALYSIS, ROUTINE W REFLEX MICROSCOPIC  BASIC METABOLIC PANEL WITH  GFR    EKG None  Radiology No results found.  Procedures Procedures    Medications Ordered in ED Medications  morphine (PF) 4 MG/ML injection 4 mg (has no administration in time range)  ketorolac (TORADOL) 15 MG/ML injection 15 mg (has no administration in time range)    ED Course/ Medical Decision Making/ A&P                                 Medical Decision Making Amount and/or Complexity of Data Reviewed Labs: ordered. Radiology: ordered.  Risk Prescription drug management.   This patient is a 68 y.o. female  who presents to the ED for concern of flank pain, abdominal pain.   Differential diagnoses prior to evaluation: The emergent differential diagnosis includes, but is not limited to,  AAA, renal vascular thrombosis, mesenteric ischemia, pyelonephritis, nephrolithiasis, cystitis, biliary colic, pancreatitis, PUD, appendicitis, diverticulitis, bowel obstruction . This is not an exhaustive differential.   Past Medical History / Co-morbidities / Social History: hypertension, fibromyalgia, hypothyroidism  Additional history: Chart reviewed. Pertinent results include: reviewed labwork, imaging from previous ED visits  Physical Exam: Physical exam performed. The pertinent findings include: Focal tenderness to palpation in the right flank, positive CVA tenderness, tenderness palpation extends towards the right suprapubic region  Some pain does radiate up into the right upper quadrant and epigastric region, some questionable Murphy sign.  Significant hypertension on arrival, blood pressure 213/94.  She endorses no chest pain.  Think likely partially secondary to pain and underlying poorly controlled hypertension.  Lab Tests/Imaging studies: I personally interpreted labs/imaging and the pertinent results include: CBC unremarkable, BMP unremarkable, UA unremarkable, notably with no hemoglobin, independently interpreted CT renal stone study which shows stones inside the  kidneys but no ureteral stone, no obstructing stone, she does have some cholelithiasis.  Given that her exam was focally tender in the right upper quadrant will add on hepatic function panel, lipase, and ultrasound of the gallbladder.  Hepatic function panel and lipase are unremarkable, right upper quadrant ultrasound with gallstones but no acute cholecystitis.  agree with the radiologist interpretation.  Medications: I ordered medication including toradol, morphine, for pain.  I have reviewed the patients home medicines and have made adjustments as needed.   Disposition: After consideration of the diagnostic results and the patients response to treatment, I feel that patient is stable for discharge with plan as above .   emergency department workup does not suggest an emergent condition requiring admission or immediate intervention beyond what has been performed at this time. The  plan is: as above. The patient is safe for discharge and has been instructed to return immediately for worsening symptoms, change in symptoms or any other concerns.  Final Clinical Impression(s) / ED Diagnoses Final diagnoses:  None    Rx / DC Orders ED Discharge Orders     None         Olene Floss, PA-C 08/27/23 1433    Melene Plan, DO 08/27/23 1622

## 2023-08-27 NOTE — ED Triage Notes (Signed)
 Reports severe right flank pain that started this morning.  Denies injury or urinary symptoms.  +nausea

## 2023-09-04 ENCOUNTER — Ambulatory Visit (INDEPENDENT_AMBULATORY_CARE_PROVIDER_SITE_OTHER): Payer: Self-pay | Admitting: Urology

## 2023-09-04 DIAGNOSIS — N3946 Mixed incontinence: Secondary | ICD-10-CM

## 2023-09-04 DIAGNOSIS — Z87442 Personal history of urinary calculi: Secondary | ICD-10-CM

## 2023-09-04 DIAGNOSIS — N200 Calculus of kidney: Secondary | ICD-10-CM

## 2023-09-04 LAB — MICROSCOPIC EXAMINATION
Epithelial Cells (non renal): >10 /HPF — AB (ref 0–10)
RBC, Urine: NONE SEEN /HPF (ref 0–2)

## 2023-09-04 LAB — URINALYSIS, COMPLETE
Bilirubin, UA: NEGATIVE
Glucose, UA: NEGATIVE
Ketones, UA: NEGATIVE
Nitrite, UA: NEGATIVE
Protein,UA: NEGATIVE
RBC, UA: NEGATIVE
Specific Gravity, UA: 1.015 (ref 1.005–1.030)
Urobilinogen, Ur: 0.2 mg/dL (ref 0.2–1.0)
pH, UA: 7 (ref 5.0–7.5)

## 2023-09-04 MED ORDER — GEMTESA 75 MG PO TABS: 75.0000 mg | ORAL_TABLET | Freq: Every day | ORAL | Status: AC

## 2023-09-04 NOTE — Progress Notes (Signed)
 09/04/2023 8:47 AM   Angelica Hess 1955/06/15 [id number]  Referring provider: Marguarite Arbour, MD [address] [place] [facility] [facility] [place],  [place] [address]  Chief Complaint  Patient presents with   Follow-up    HPI: I was consulted to assess the patient's 63-month history of urinary incontinence.  She leaks with coughing sneezing laughing but not bending lifting.  She has urge incontinence with little warning.  Both are significant.  I do not think she has bedwetting.  She wears 6-10 pads a day moderately wet or soaked.  She reported a distant history of blood in the urine   She voids every 1 or 2 hours.  In dental office sometimes she has to void every 20 minutes due to urgency.  She cannot hold it for 2 hours.   Exam a bit limited due to obesity.  Well supported bladder neck and negative cough test   Patient has high-volume mixed incontinence.  Role of cystoscopy and urodynamics discussed.  No blood in urine today.  She smoked many years ago.  Role of CT scan discussed.  Patient currently does not have insurance.  We will get a try her on oxybutynin ER 10 mg with 30 tabs 11 refills I will reassess her in 6 weeks.  We will call her and tell her how much it costs for cystoscopy and CT scan and she might be willing to pay but we want to get the numbers first.  I held off on urodynamics because of cost and hopefully she will get insurance in the future.   No CT scan performed.  Urine culture negative Much less urgency and can hold urination but still pad count similar.  Clinically not infected   Cystoscopy: Normal   Patient still does not have insurance.  Reassess in 6 weeks on oxybutynin ER 10 mg in combination with Myrbetriq 50 mg samples.  Taken into account insurance status   Patient is dramatically better by 80 to 90% on combination treatment.  The samples ran out she cannot afford them due to no insurance.  She still on oxybutynin with some improvement.     Urge  incontinence dramatically better. I gave 3 months of Myrbetriq samples.  She will come in every 3 months and picks them up.  I called in oxybutynin 30x11.     Today When I saw her last year and she saw the nurse practitioner as well she was trying to pass a 2 mm stone on the right side.   Patient has good control on oxybutynin and taking the Myrbetriq every 2 or 3 days.  She had a CT scan stone protocol July 02, 2021 and a 2 mm stone had migrated to the right ureterovesical junction.  She did not have follow-up.  She still has intermittent discomfort in the right flank and right lower quadrant but was a bit nonspecific.   The patient had had the stone for a number of months before the CT scan.  I thought it was best to rescan to make certain she has passed the stone.  I will have her see our nurse practitioner at the CT stone protocol.  If she still has a stone she may need to see one of my partners for ureteroscopy due to its chronic nature.  More Myrbetriq samples given but at 25 mg due to availability of samples.  Reassess on oxybutynin and Myrbetriq in 6 months.  Call if culture positive.  Importantly she did not have  microscopic hematuria today      Today Patient did pass the stone.  Has a very well on oxybutynin and Myrbetriq 25 mg every second day.  No infections.  When she stopped the Myrbetriq her incontinence worsens Oxybutynin prescription 30 x 11 renewed. 3 months of Myrbetriq given and see again in 6 months for samples    Patient did pass the stone. Has a very well on oxybutynin and Myrbetriq 25 mg every second day. No infections. When she stopped the Myrbetriq her incontinence worsens Oxybutynin prescription 30 x 11 renewed.  3 months of Myrbetriq given and see again in 6 months for samples She could not afford the Myrbetriq and was taking it every second day with the oxybutynin and doing very well   She will stay on oxybutynin renewed. Take Gemtesa every 2 days. Reassess in 3 months  6 weeks of samples given.    Today She thinks the [place] and the oxybutynin worked about the same but she still has days that she can use many pads up to 10.  Clinically not infected but I sent the urine for culture.  Frequency stable.  Still has insurance concerns and does not want workup.  Give 8 weeks of Gemtesa and reassess on combination treatment in 4 months and continue with protocol  TOday Incontinence dramatically better on oxybutynin and Gemtesa.  Clinically not infected.  Frequency stable    PMH: Past Medical History:  Diagnosis Date   Fibromyalgia    Genital herpes    Hypertension    Hypothyroidism    Sleep apnea     Surgical History: Past Surgical History:  Procedure Laterality Date   CESAREAN SECTION     TEMPOROMANDIBULAR JOINT SURGERY      Home Medications:  Allergies as of 09/04/2023       Reactions   Penicillins Itching   Sulfa Antibiotics    Allergy as a child   Buspirone Anxiety        Medication List        Accurate as of September 04, 2023  8:47 AM. If you have any questions, ask your nurse or doctor.          albuterol 108 (90 Base) MCG/ACT inhaler Commonly known as: VENTOLIN HFA Inhale into the lungs.   ALPRAZolam 0.25 MG tablet Commonly known as: XANAX Take 0.25 mg by mouth 2 (two) times daily as needed.   fexofenadine 180 MG tablet Commonly known as: ALLEGRA Take 1 tablet (180 mg total) by mouth daily.   Gemtesa 75 MG Tabs Generic drug: Vibegron Take 1 tablet (75 mg total) by mouth every other day.   Gemtesa 75 MG Tabs Generic drug: Vibegron Take 1 tablet (75 mg total) by mouth daily.   hydrochlorothiazide 25 MG tablet Commonly known as: HYDRODIURIL Take 1 tablet by mouth daily.   ibuprofen 200 MG tablet Commonly known as: ADVIL Take 200 mg by mouth as needed.   levothyroxine 50 MCG tablet Commonly known as: SYNTHROID Take 1 tablet (50 mcg total) by mouth daily.   methocarbamol 500 MG tablet Commonly known as:  ROBAXIN Take 1 tablet (500 mg total) by mouth 2 (two) times daily.   metoprolol tartrate 25 MG tablet Commonly known as: LOPRESSOR Take 25 mg by mouth 2 (two) times daily.   olmesartan 40 MG tablet Commonly known as: BENICAR Take 40 mg by mouth daily.   oxybutynin 10 MG 24 hr tablet Commonly known as: DITROPAN-XL Take 1 tablet (10 mg total) by  mouth daily.   rosuvastatin 10 MG tablet Commonly known as: CRESTOR Take 1 tablet by mouth daily.   topiramate 50 MG tablet Commonly known as: TOPAMAX Take 50 mg by mouth 2 (two) times daily.   valACYclovir 500 MG tablet Commonly known as: VALTREX Take 1 tablet by mouth 2 (two) times daily.   Vitamin D (Ergocalciferol) 1.25 MG (50000 UNIT) Caps capsule Commonly known as: DRISDOL Take 50,000 Units by mouth once a week.        Allergies:  Allergies  Allergen Reactions   Penicillins Itching   Sulfa Antibiotics     Allergy as a child   Buspirone Anxiety    Family History: Family History  Problem Relation Age of Onset   Alzheimer's disease Mother    Breast cancer Mother 89    Social History:  reports that she has quit smoking. She has been exposed to tobacco smoke. She has never used smokeless tobacco. She reports that she does not drink alcohol and does not use drugs.  ROS:                                        Physical Exam: There were no vitals taken for this visit.  Constitutional:  Alert and oriented, No acute distress. HEENT: [place] AT, moist mucus membranes.  Trachea midline, no masses.   Laboratory Data: Lab Results  Component Value Date   WBC 7.6 08/27/2023   HGB 14.7 08/27/2023   HCT 45.5 08/27/2023   MCV 95.6 08/27/2023   PLT 218 08/27/2023    Lab Results  Component Value Date   CREATININE 0.76 08/27/2023    No results found for: "PSA"  No results found for: "TESTOSTERONE"  Lab Results  Component Value Date   HGBA1C 5.6 05/14/2018    Urinalysis    Component Value  Date/Time   COLORURINE YELLOW 08/27/2023 1034   APPEARANCEUR HAZY (A) 08/27/2023 1034   APPEARANCEUR Hazy (A) 02/06/2023 1108   LABSPEC 1.013 08/27/2023 1034   PHURINE 7.0 08/27/2023 1034   GLUCOSEU NEGATIVE 08/27/2023 1034   HGBUR NEGATIVE 08/27/2023 1034   BILIRUBINUR NEGATIVE 08/27/2023 1034   BILIRUBINUR Negative 02/06/2023 1108   KETONESUR NEGATIVE 08/27/2023 1034   PROTEINUR NEGATIVE 08/27/2023 1034   UROBILINOGEN 0.2 10/25/2018 1437   UROBILINOGEN 0.2 11/02/2009 1300   NITRITE NEGATIVE 08/27/2023 1034   LEUKOCYTESUR NEGATIVE 08/27/2023 1034    Pertinent Imaging:   Assessment & Plan: 3 months of samples given of Gemtesa.  She takes it every second day and does great.  See her in 6 months  1. Mixed incontinence (Primary)  - Urinalysis, Complete  2. Nephrolithiasis  - Urinalysis, Complete   No follow-ups on file.  Devorah Fonder, MD  [facility] [address] [place], [place] [address] [phone]

## 2024-01-01 ENCOUNTER — Ambulatory Visit (INDEPENDENT_AMBULATORY_CARE_PROVIDER_SITE_OTHER): Payer: Self-pay | Admitting: Urology

## 2024-01-01 VITALS — HR 67 | Ht 62.0 in | Wt 269.6 lb

## 2024-01-01 DIAGNOSIS — N3946 Mixed incontinence: Secondary | ICD-10-CM

## 2024-01-01 MED ORDER — GEMTESA 75 MG PO TABS: 1.0000 | ORAL_TABLET | Freq: Every day | ORAL | Status: DC

## 2024-01-01 NOTE — Progress Notes (Signed)
 01/01/2024 9:11 AM   Marthe Bari Aug 05, 1955 [id number]  Referring provider: Auston Reyes BIRCH, MD [address] [facility] [place],  [place] [address]  No chief complaint on file.   HPI: Reviewed lengthy note from April 2025.  Was given 3 months of samples and she was asked to take it every second day and see in 6 months.  She takes it in combination with oxybutynin  Excellent bladder control.  Very pleased.  Frequency stable.  No infections   PMH: Past Medical History:  Diagnosis Date   Fibromyalgia    Genital herpes    Hypertension    Hypothyroidism    Sleep apnea     Surgical History: Past Surgical History:  Procedure Laterality Date   CESAREAN SECTION     TEMPOROMANDIBULAR JOINT SURGERY      Home Medications:  Allergies as of 01/01/2024       Reactions   Penicillins Itching   Sulfa  Antibiotics    Allergy as a child   Buspirone Anxiety        Medication List        Accurate as of January 01, 2024  9:11 AM. If you have any questions, ask your nurse or doctor.          albuterol 108 (90 Base) MCG/ACT inhaler Commonly known as: VENTOLIN HFA Inhale into the lungs.   ALPRAZolam 0.25 MG tablet Commonly known as: XANAX Take 0.25 mg by mouth 2 (two) times daily as needed.   fexofenadine  180 MG tablet Commonly known as: ALLEGRA  Take 1 tablet (180 mg total) by mouth daily.   Gemtesa  75 MG Tabs Generic drug: Vibegron  Take 1 tablet (75 mg total) by mouth every other day.   Gemtesa  75 MG Tabs Generic drug: Vibegron  Take 1 tablet (75 mg total) by mouth daily.   hydrochlorothiazide  25 MG tablet Commonly known as: HYDRODIURIL  Take 1 tablet by mouth daily.   ibuprofen 200 MG tablet Commonly known as: ADVIL Take 200 mg by mouth as needed.   levothyroxine  50 MCG tablet Commonly known as: SYNTHROID  Take 1 tablet (50 mcg total) by mouth daily.   methocarbamol  500 MG tablet Commonly known as: ROBAXIN  Take 1 tablet (500 mg total) by  mouth 2 (two) times daily.   metoprolol tartrate 25 MG tablet Commonly known as: LOPRESSOR Take 25 mg by mouth 2 (two) times daily.   olmesartan  40 MG tablet Commonly known as: BENICAR  Take 40 mg by mouth daily.   oxybutynin  10 MG 24 hr tablet Commonly known as: DITROPAN -XL Take 1 tablet (10 mg total) by mouth daily.   rosuvastatin 10 MG tablet Commonly known as: CRESTOR Take 1 tablet by mouth daily.   topiramate 50 MG tablet Commonly known as: TOPAMAX Take 50 mg by mouth 2 (two) times daily.   valACYclovir  500 MG tablet Commonly known as: VALTREX  Take 1 tablet by mouth 2 (two) times daily.   Vitamin D  (Ergocalciferol ) 1.25 MG (50000 UNIT) Caps capsule Commonly known as: DRISDOL  Take 50,000 Units by mouth once a week.        Allergies:  Allergies  Allergen Reactions   Penicillins Itching   Sulfa  Antibiotics     Allergy as a child   Buspirone Anxiety    Family History: Family History  Problem Relation Age of Onset   Alzheimer's disease Mother    Breast cancer Mother 32    Social History:  reports that she has quit smoking. She has been exposed to tobacco smoke.  She has never used smokeless tobacco. She reports that she does not drink alcohol and does not use drugs.  ROS:                                        Physical Exam: There were no vitals taken for this visit.  Constitutional:  Alert and oriented, No acute distress. HEENT: [place] AT, moist mucus membranes.  Trachea midline, no masses.   Laboratory Data: Lab Results  Component Value Date   WBC 7.6 08/27/2023   HGB 14.7 08/27/2023   HCT 45.5 08/27/2023   MCV 95.6 08/27/2023   PLT 218 08/27/2023    Lab Results  Component Value Date   CREATININE 0.76 08/27/2023    No results found for: PSA  No results found for: TESTOSTERONE  Lab Results  Component Value Date   HGBA1C 5.6 05/14/2018    Urinalysis    Component Value Date/Time   COLORURINE YELLOW 08/27/2023  1034   APPEARANCEUR Clear 09/04/2023 0843   LABSPEC 1.013 08/27/2023 1034   PHURINE 7.0 08/27/2023 1034   GLUCOSEU Negative 09/04/2023 0843   HGBUR NEGATIVE 08/27/2023 1034   BILIRUBINUR Negative 09/04/2023 0843   KETONESUR NEGATIVE 08/27/2023 1034   PROTEINUR Negative 09/04/2023 0843   PROTEINUR NEGATIVE 08/27/2023 1034   UROBILINOGEN 0.2 10/25/2018 1437   UROBILINOGEN 0.2 11/02/2009 1300   NITRITE Negative 09/04/2023 0843   NITRITE NEGATIVE 08/27/2023 1034   LEUKOCYTESUR Trace (A) 09/04/2023 0843   LEUKOCYTESUR NEGATIVE 08/27/2023 1034    Pertinent Imaging:   Assessment & Plan: 3 months of samples given.  Reassess in 6 months on combination therapy.  Patient is very grateful  1. Mixed incontinence (Primary)  - Urinalysis, Complete   No follow-ups on file.  Glendia DELENA Elizabeth, MD  [facility] [address] [place], [place] [address] [phone]

## 2024-04-09 ENCOUNTER — Other Ambulatory Visit: Payer: Self-pay

## 2024-04-09 DIAGNOSIS — N3946 Mixed incontinence: Secondary | ICD-10-CM

## 2024-04-09 MED ORDER — OXYBUTYNIN CHLORIDE ER 10 MG PO TB24: 10.0000 mg | ORAL_TABLET | Freq: Every day | ORAL | 3 refills | Status: AC

## 2024-06-24 ENCOUNTER — Other Ambulatory Visit: Payer: Self-pay | Admitting: Internal Medicine

## 2024-06-24 DIAGNOSIS — Z1231 Encounter for screening mammogram for malignant neoplasm of breast: Secondary | ICD-10-CM

## 2024-07-08 ENCOUNTER — Ambulatory Visit: Payer: Self-pay | Admitting: Urology
